# Patient Record
Sex: Female | Born: 1955 | Race: White | Hispanic: No | Smoking: Never smoker
Health system: Southern US, Academic
[De-identification: ages and names within clinical notes are randomized; demographics above are authoritative.]

## PROBLEM LIST (undated history)

## (undated) DIAGNOSIS — R002 Palpitations: Secondary | ICD-10-CM

## (undated) DIAGNOSIS — E079 Disorder of thyroid, unspecified: Secondary | ICD-10-CM

## (undated) DIAGNOSIS — M5126 Other intervertebral disc displacement, lumbar region: Secondary | ICD-10-CM

## (undated) DIAGNOSIS — M51369 Other intervertebral disc degeneration, lumbar region without mention of lumbar back pain or lower extremity pain: Secondary | ICD-10-CM

## (undated) DIAGNOSIS — I499 Cardiac arrhythmia, unspecified: Secondary | ICD-10-CM

## (undated) DIAGNOSIS — J309 Allergic rhinitis, unspecified: Secondary | ICD-10-CM

## (undated) DIAGNOSIS — K219 Gastro-esophageal reflux disease without esophagitis: Secondary | ICD-10-CM

## (undated) DIAGNOSIS — Z87898 Personal history of other specified conditions: Secondary | ICD-10-CM

## (undated) DIAGNOSIS — I10 Essential (primary) hypertension: Secondary | ICD-10-CM

## (undated) DIAGNOSIS — H269 Unspecified cataract: Secondary | ICD-10-CM

## (undated) DIAGNOSIS — Z87442 Personal history of urinary calculi: Secondary | ICD-10-CM

## (undated) DIAGNOSIS — E785 Hyperlipidemia, unspecified: Secondary | ICD-10-CM

## (undated) DIAGNOSIS — L719 Rosacea, unspecified: Secondary | ICD-10-CM

## (undated) DIAGNOSIS — M1990 Unspecified osteoarthritis, unspecified site: Secondary | ICD-10-CM

## (undated) DIAGNOSIS — H9190 Unspecified hearing loss, unspecified ear: Secondary | ICD-10-CM

## (undated) DIAGNOSIS — Z872 Personal history of diseases of the skin and subcutaneous tissue: Secondary | ICD-10-CM

## (undated) DIAGNOSIS — Z860100 Personal history of colon polyps, unspecified: Secondary | ICD-10-CM

## (undated) DIAGNOSIS — R7989 Other specified abnormal findings of blood chemistry: Secondary | ICD-10-CM

## (undated) DIAGNOSIS — R911 Solitary pulmonary nodule: Secondary | ICD-10-CM

## (undated) DIAGNOSIS — K449 Diaphragmatic hernia without obstruction or gangrene: Secondary | ICD-10-CM

## (undated) DIAGNOSIS — E559 Vitamin D deficiency, unspecified: Secondary | ICD-10-CM

## (undated) DIAGNOSIS — R251 Tremor, unspecified: Secondary | ICD-10-CM

## (undated) DIAGNOSIS — R7303 Prediabetes: Secondary | ICD-10-CM

## (undated) DIAGNOSIS — F32A Depression, unspecified: Secondary | ICD-10-CM

## (undated) DIAGNOSIS — E538 Deficiency of other specified B group vitamins: Secondary | ICD-10-CM

## (undated) DIAGNOSIS — C801 Malignant (primary) neoplasm, unspecified: Secondary | ICD-10-CM

## (undated) HISTORY — PX: HX CYSTOSCOPY: 2100001404

## (undated) HISTORY — DX: Depression, unspecified: F32.A

## (undated) HISTORY — PX: HX TONSILLECTOMY: SHX27

## (undated) HISTORY — DX: Tremor, unspecified: R25.1

## (undated) HISTORY — DX: Unspecified osteoarthritis, unspecified site: M19.90

## (undated) HISTORY — DX: Disorder of thyroid, unspecified: E07.9

## (undated) HISTORY — DX: Vitamin D deficiency, unspecified: E55.9

## (undated) HISTORY — DX: Deficiency of other specified B group vitamins: E53.8

## (undated) HISTORY — DX: Personal history of other specified conditions: Z87.898

## (undated) HISTORY — PX: HX TUBAL LIGATION: SHX77

## (undated) HISTORY — DX: Other intervertebral disc displacement, lumbar region: M51.26

## (undated) HISTORY — DX: Personal history of colon polyps, unspecified: Z86.0100

## (undated) HISTORY — DX: Personal history of diseases of the skin and subcutaneous tissue: Z87.2

## (undated) HISTORY — DX: Palpitations: R00.2

## (undated) HISTORY — PX: HEMORROIDECTOMY: SUR656

## (undated) HISTORY — DX: Personal history of urinary calculi: Z87.442

## (undated) HISTORY — DX: Unspecified hearing loss, unspecified ear: H91.90

## (undated) HISTORY — DX: Other specified abnormal findings of blood chemistry: R79.89

## (undated) HISTORY — PX: ABDOMINAL SURGERY: SHX537

## (undated) HISTORY — PX: KNEE SURGERY: SHX244

## (undated) HISTORY — DX: Solitary pulmonary nodule: R91.1

## (undated) HISTORY — PX: REPLACEMENT TOTAL KNEE: SUR1224

## (undated) HISTORY — DX: Other intervertebral disc degeneration, lumbar region without mention of lumbar back pain or lower extremity pain: M51.369

## (undated) HISTORY — DX: Cardiac arrhythmia, unspecified: I49.9

## (undated) HISTORY — DX: Rosacea, unspecified: L71.9

## (undated) HISTORY — PX: HX GALL BLADDER SURGERY/CHOLE: SHX55

## (undated) HISTORY — PX: DENTAL SURGERY: SHX609

## (undated) HISTORY — DX: Allergic rhinitis, unspecified: J30.9

## (undated) HISTORY — PX: COLONOSCOPY: SHX174

## (undated) HISTORY — PX: ORTHOPEDIC SURGERY: SHX850

## (undated) HISTORY — PX: HX KNEE REPLACMENT: SHX125

## (undated) HISTORY — PX: KNEE ARTHROSCOPY: SUR90

## (undated) HISTORY — PX: HX THYROID BIOPSY: 2101000001

## (undated) HISTORY — DX: Prediabetes: R73.03

## (undated) HISTORY — DX: Hyperlipidemia, unspecified: E78.5

## (undated) HISTORY — PX: HX CATARACT REMOVAL: SHX102

## (undated) HISTORY — PX: HX EYE SURGERY: 2100001143

## (undated) HISTORY — DX: Unspecified cataract: H26.9

---

## 1999-02-12 ENCOUNTER — Other Ambulatory Visit (HOSPITAL_COMMUNITY): Payer: Self-pay

## 2007-12-20 DIAGNOSIS — K5730 Diverticulosis of large intestine without perforation or abscess without bleeding: Secondary | ICD-10-CM | POA: Insufficient documentation

## 2007-12-20 DIAGNOSIS — K635 Polyp of colon: Secondary | ICD-10-CM | POA: Insufficient documentation

## 2017-08-30 IMAGING — MG 3D SCREENING MAMMO BIL W/CAD
5 series · 9 of 24 positions shown · non-contrast
Comparison: 05/10/2017 and 01/04/2016.

------------- REPORT [id number] -------------
[facility]
[address]
Daina Ms.TOWNER, EDILIA:
We wish to report the following on your recent mammography examination. We are sending a report to your referring physician or other health care provider. 
(       Normal/Negative:
No evidence of cancer.
This statement is mandated by the [place], Department of Health.
Your examination was performed by one of our technologists, who are registered radiological technologists and also specially certified in mammography:
___
Parlak, Edaly (M)
Dang, Mcalex (M)

Your mammogram was interpreted by our radiologist.
( 
Sofeine Made, M.D.
(Annual Breast Examination by a physician or other health care provider
(Annual Mammography Screening beginning at age 40
(Monthly Breast Self Examination
------------- REPORT [id number] -------------
NESIGILINK, AIVARUNCE
EXAM:  3D BILATERAL ANNUAL SCREENING DIGITAL MAMMOGRAM WITH TOMOSYNTHESIS AND CAD
INDICATION: Screening.

[Series 4891: R CC · right · 0.10mm/px · 2 of 3 slices shown]
[im 1/3]
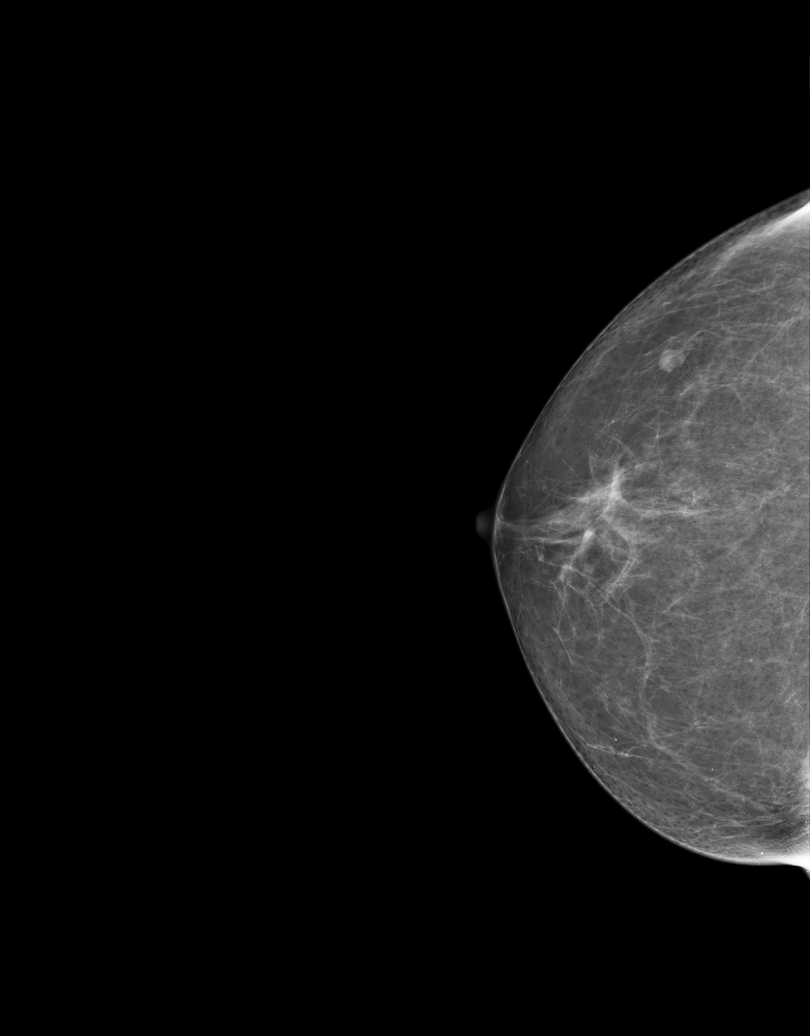
[im 3/3]
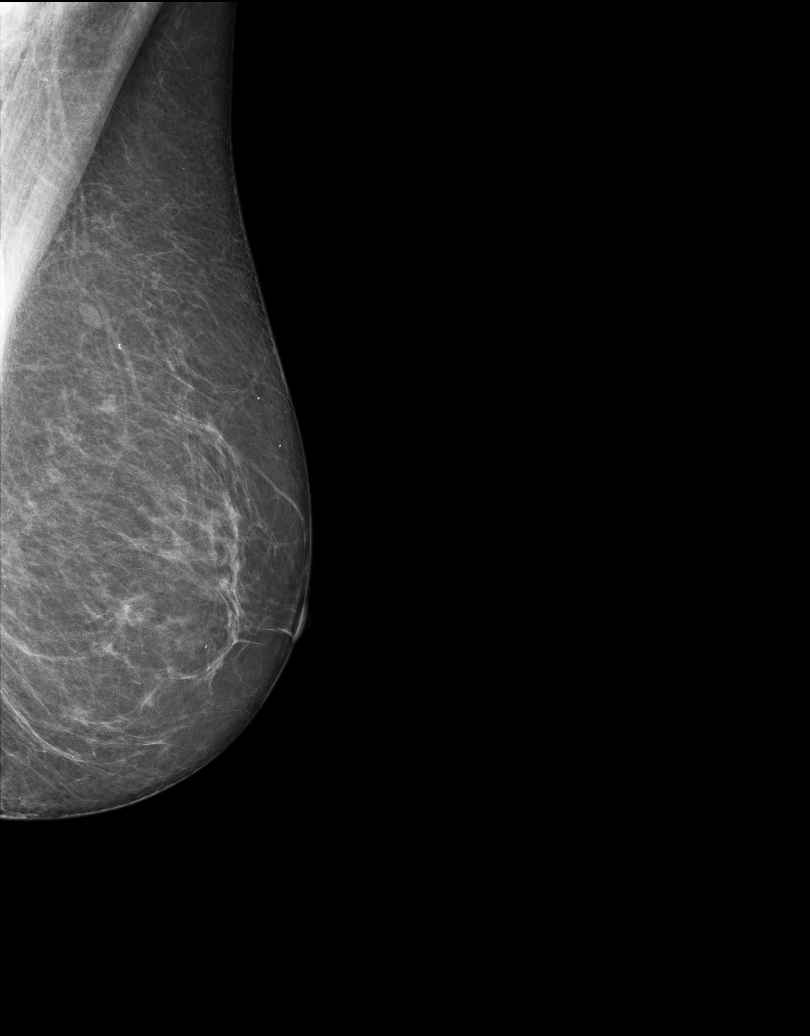

[R]
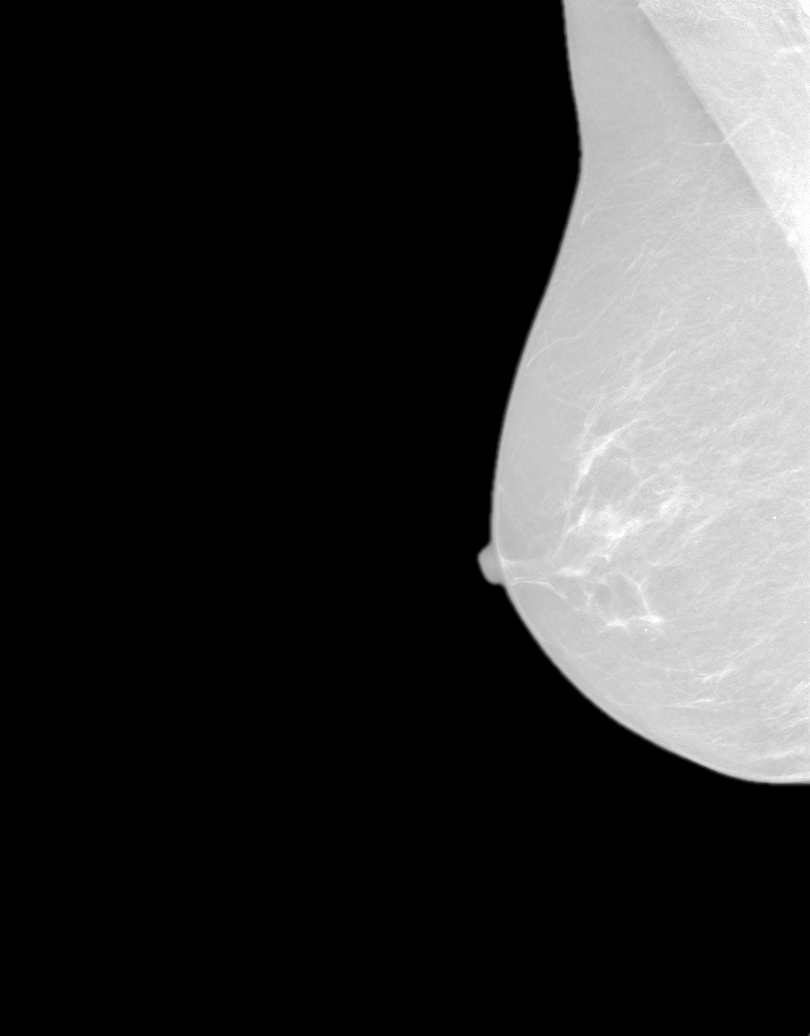

[Series 4896: 3D SCREENING MAMMO BIL W/CAD · 2 acquisitions, 4 frames shown (1 of 2)]
[im 1/2]
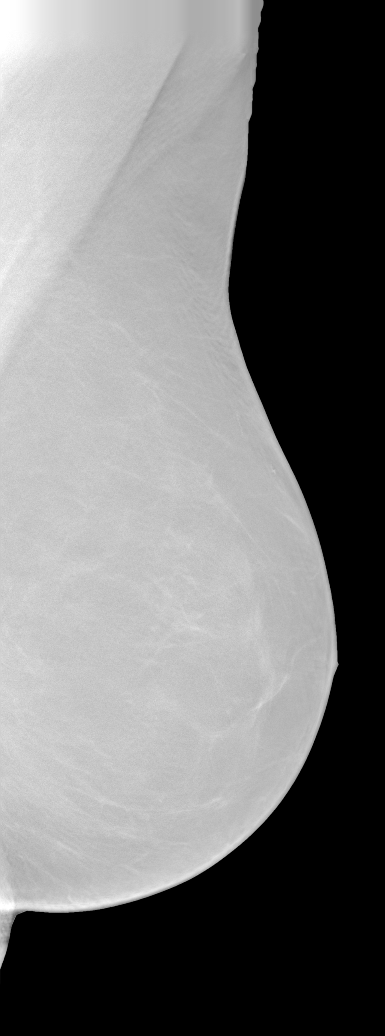
[im 1/2]
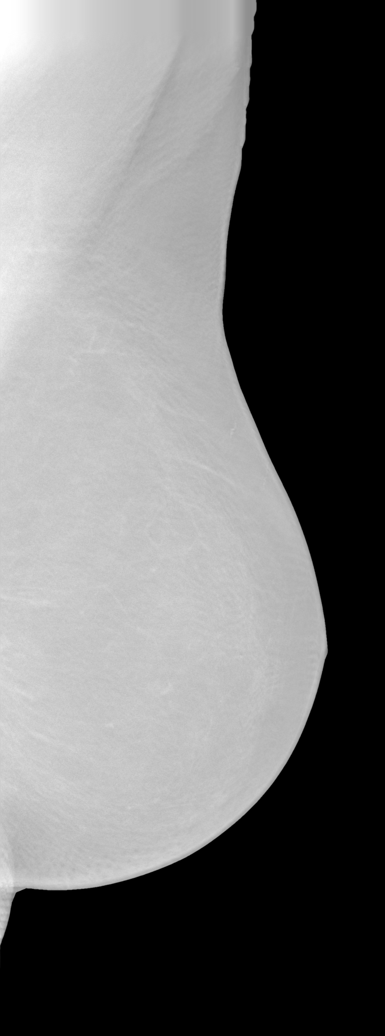
[im 2/2]
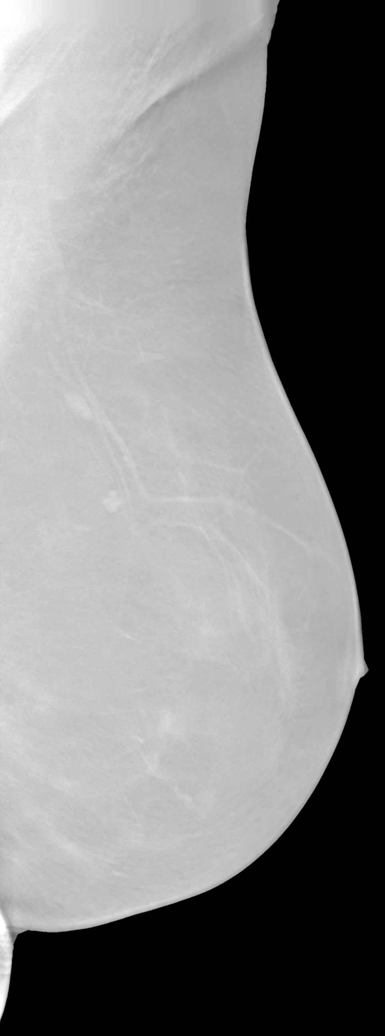
[im 2/2]
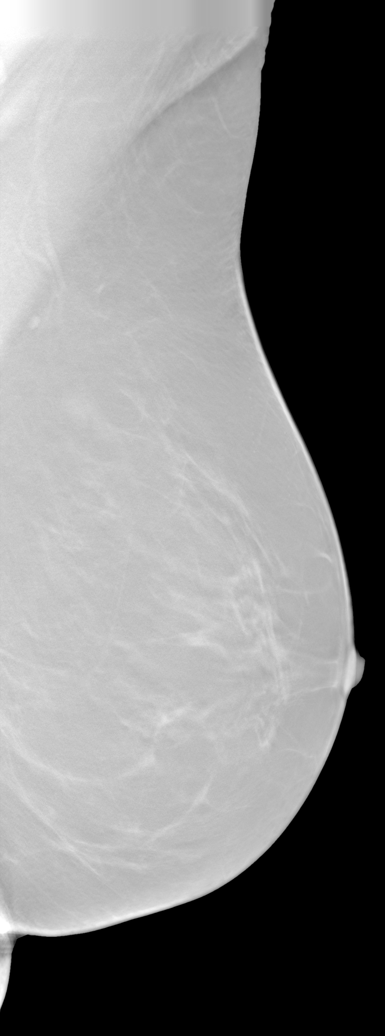

[3D SCREENING MAMMO BIL W/CAD (2 of 2) · tomo slice 13/83.0]
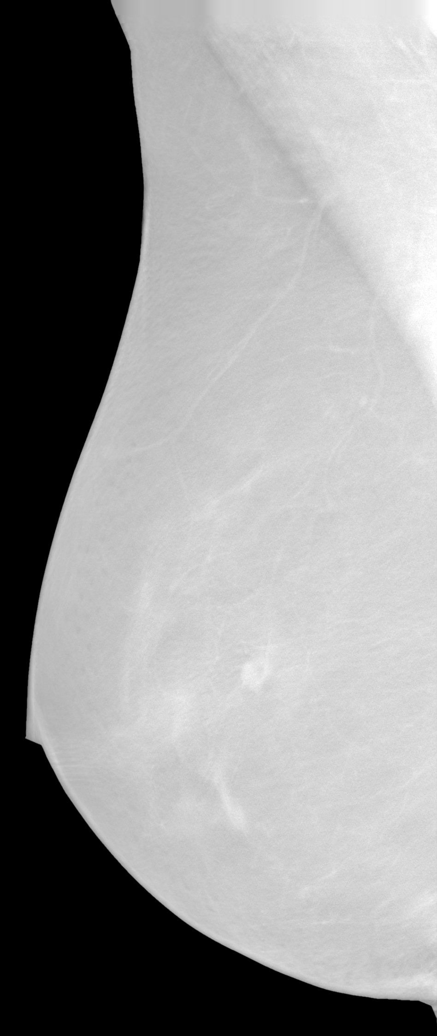

[L]
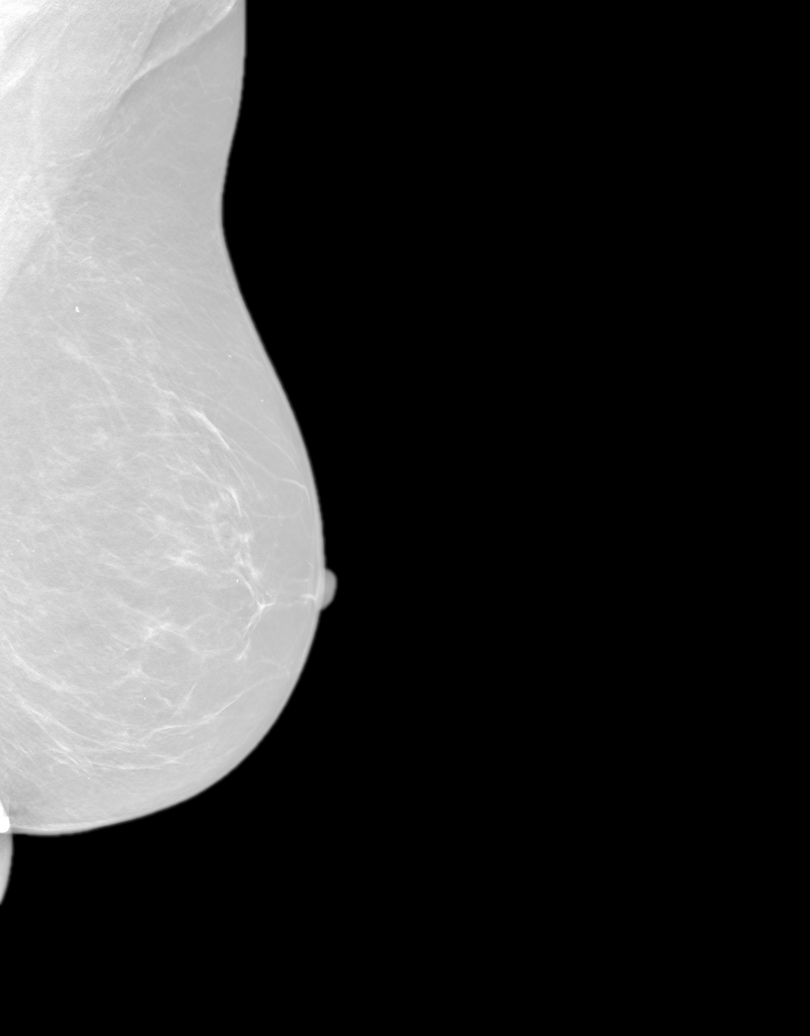

[9 of 24 positions shown; findings below may reference images not displayed]

FINDINGS: There are scattered fibroglandular elements.  There is no mass or suspicious cluster of microcalcifications.   There is no architectural distortion, skin thickening or nipple retraction.
IMPRESSION: 1.  BIRADS 2-Benign findings. Patient has been added in a reminder system with a target date for the next screening mammography.

2.  DENSITY CODE – B (Scattered areas of fibroglandular density). 

Final Assessment Code:

Bi-Rads 2 

BI-RADS 0
Need additional imaging evaluation

BI-RADS 1
Negative mammogram

BI-RADS 2
Benign finding

BI-RADS 3
Probably benign finding: short-interval follow-up suggested

BI-RADS 4
Suspicious abnormality:  biopsy should be considered

BI-RADS 5
Highly suggestive of malignancy; appropriate action should be taken

BI-RADS 6
Known Biopsy-proven Malignancy – Appropriate action should be taken

NOTE:
In compliance with Federal regulations, the results of this mammogram are being sent to the patient.

## 2018-04-05 DIAGNOSIS — F418 Other specified anxiety disorders: Secondary | ICD-10-CM | POA: Insufficient documentation

## 2018-04-05 DIAGNOSIS — E559 Vitamin D deficiency, unspecified: Secondary | ICD-10-CM | POA: Insufficient documentation

## 2018-04-05 DIAGNOSIS — T3695XA Adverse effect of unspecified systemic antibiotic, initial encounter: Secondary | ICD-10-CM | POA: Insufficient documentation

## 2018-04-05 DIAGNOSIS — I10 Essential (primary) hypertension: Secondary | ICD-10-CM | POA: Insufficient documentation

## 2018-04-05 DIAGNOSIS — Z1624 Resistance to multiple antibiotics: Secondary | ICD-10-CM | POA: Insufficient documentation

## 2018-06-28 DIAGNOSIS — M25562 Pain in left knee: Secondary | ICD-10-CM | POA: Insufficient documentation

## 2018-12-17 DIAGNOSIS — R9389 Abnormal findings on diagnostic imaging of other specified body structures: Secondary | ICD-10-CM | POA: Insufficient documentation

## 2021-02-03 DIAGNOSIS — K219 Gastro-esophageal reflux disease without esophagitis: Secondary | ICD-10-CM | POA: Insufficient documentation

## 2021-06-11 ENCOUNTER — Other Ambulatory Visit (INDEPENDENT_AMBULATORY_CARE_PROVIDER_SITE_OTHER): Payer: Self-pay | Admitting: OTOLARYNGOLOGY

## 2021-06-11 DIAGNOSIS — E041 Nontoxic single thyroid nodule: Secondary | ICD-10-CM

## 2021-06-22 IMAGING — CT CT PELVIS W/CONTRAST
2 of 4 series · 17 of 46 positions shown, 19 images · IV contrast (CONTRAST)
Comparison: None available.

﻿EXAM:  CT PELVIS W/CONTRAST
INDICATION: Right groin swelling.
TECHNIQUE: Axial CT imaging of the pelvis was performed following intravenous administration of 75 mL of Optiray 350.  Oral contrast was also administered. Images were reviewed in multiple windows and projections. Exam was performed using 1 or more of the following dose reduction techniques: Automated exposure control, adjustment of the mA and/or kV according to patient size, or the use of iterative reconstruction technique.

[Series 8020: post · axial · 0.97mm/px · z∈[-534,-285]mm · 14 of 97 slices shown, 16 images]
[im 7/97  soft-tissue]
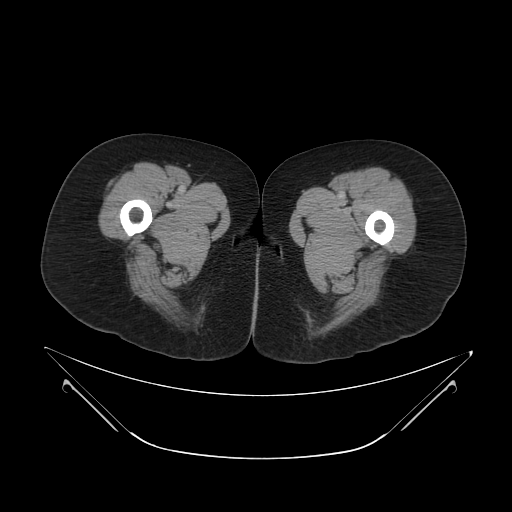
[im 7/97  bone]
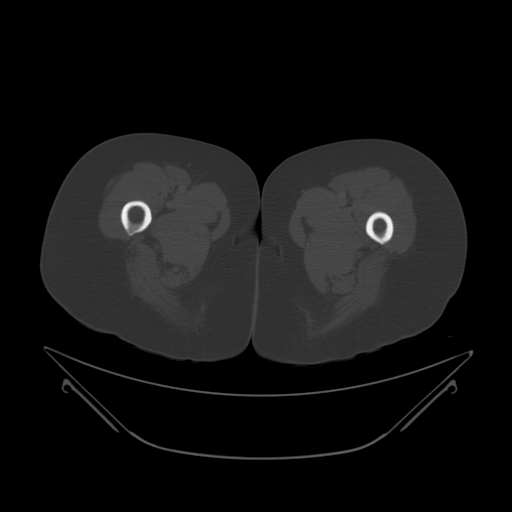
[im 13/97  soft-tissue]
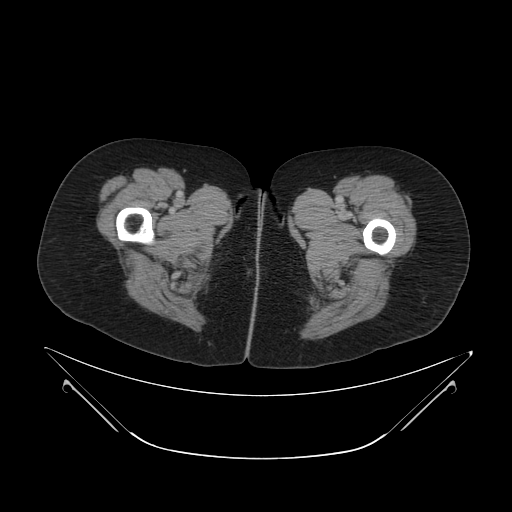
[im 20/97  soft-tissue]
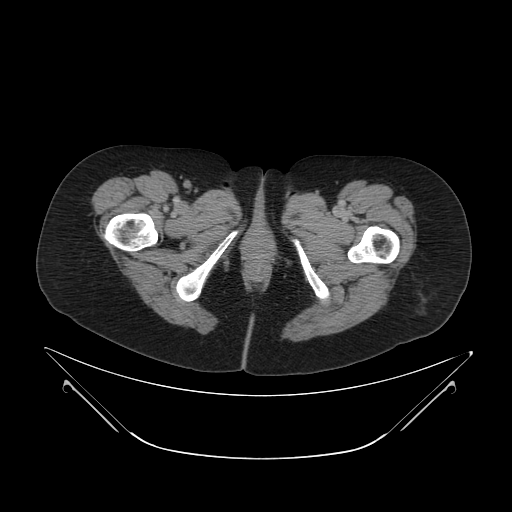
[im 26/97  soft-tissue]
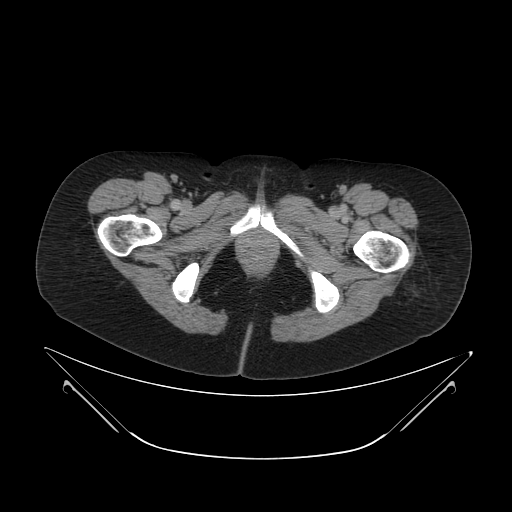
[im 33/97  soft-tissue]
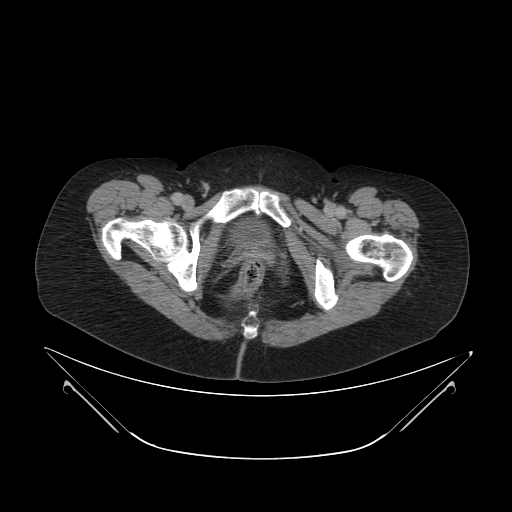
[im 39/97  soft-tissue]
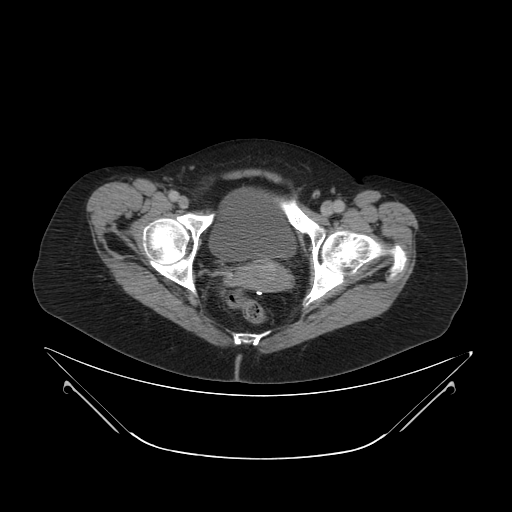
[im 45/97  soft-tissue]
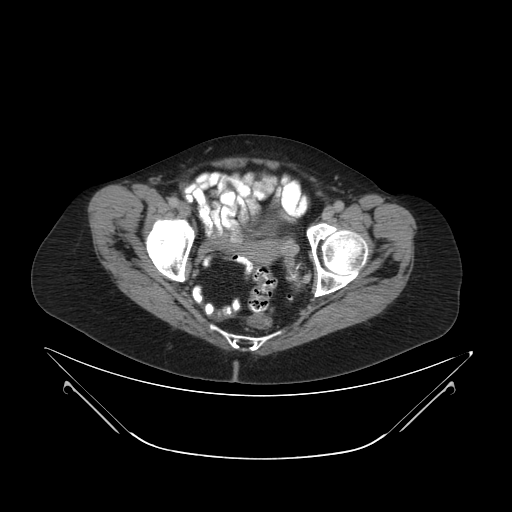
[im 52/97  soft-tissue]
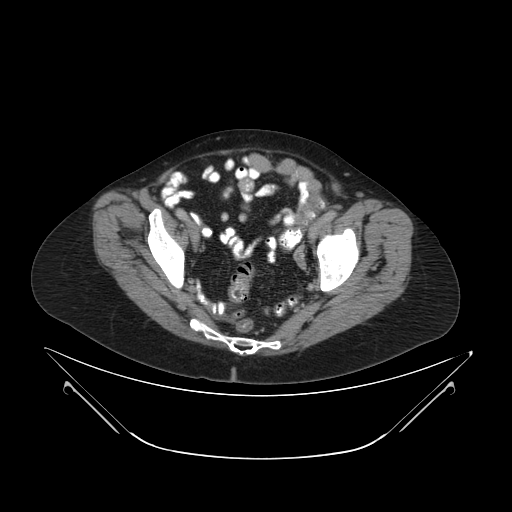
[im 58/97  soft-tissue]
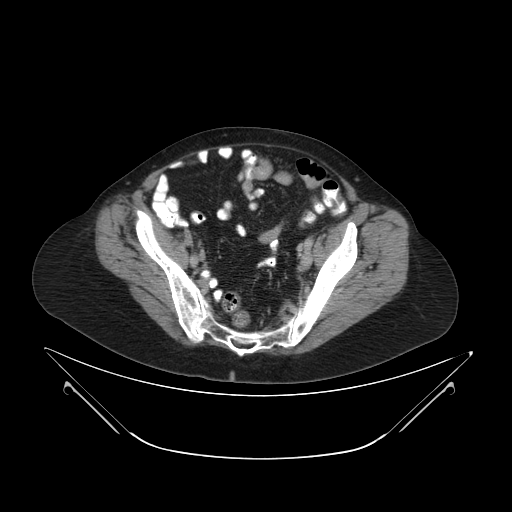
[im 58/97  bone]
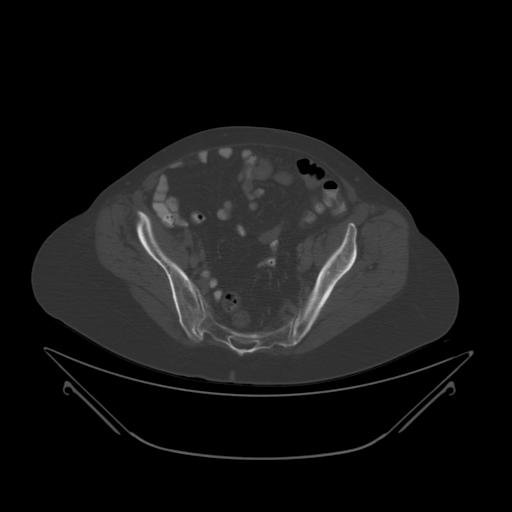
[im 65/97  soft-tissue]
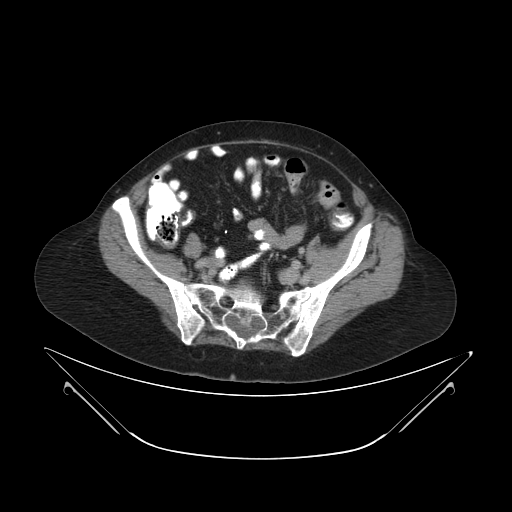
[im 71/97  soft-tissue]
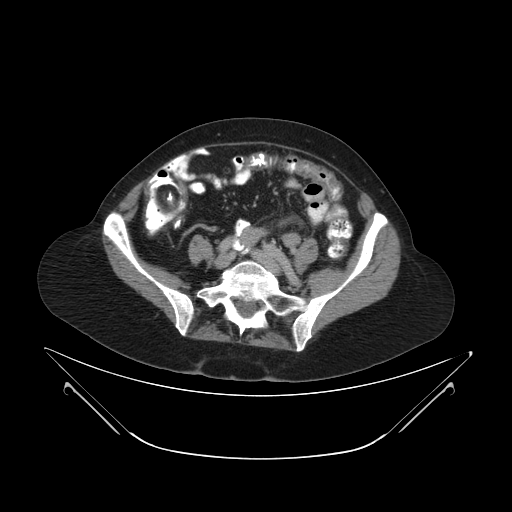
[im 77/97  soft-tissue]
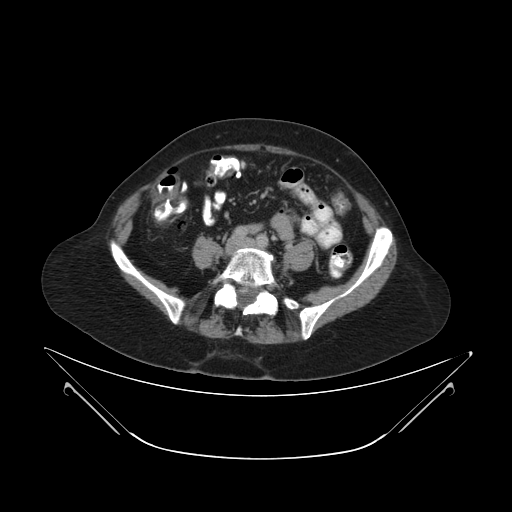
[im 84/97  soft-tissue]
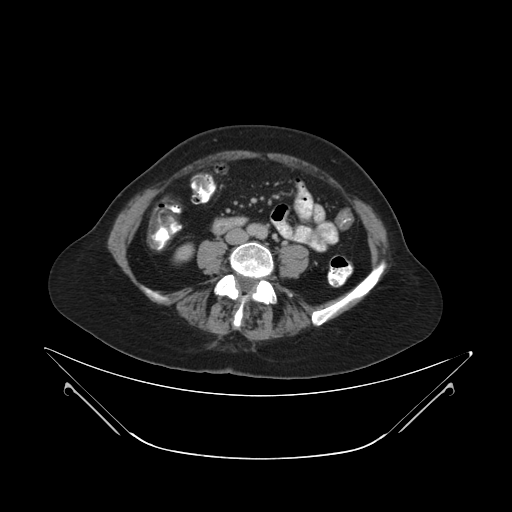
[im 90/97  soft-tissue]
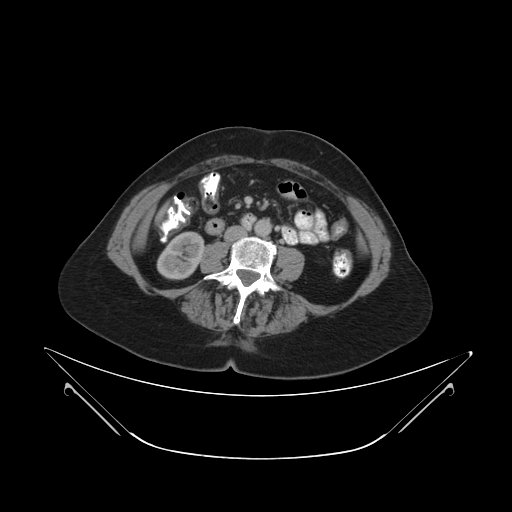

[Series 8021: cor · coronal · 0.97mm/px · 3 of 59 slices shown]
[im 20/59  soft-tissue]
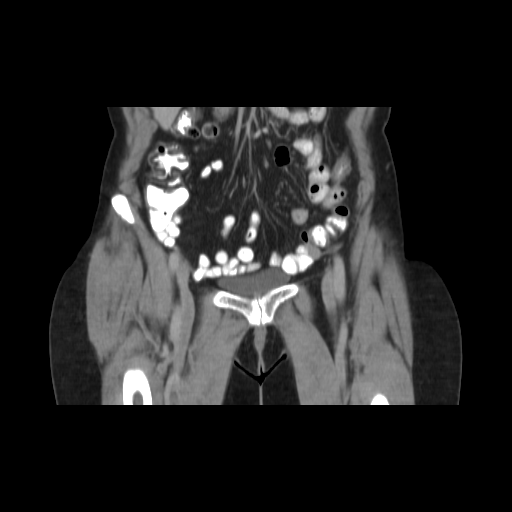
[im 26/59  soft-tissue]
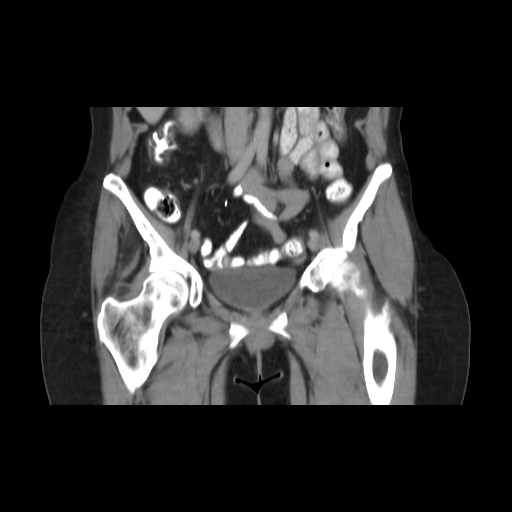
[im 33/59  soft-tissue]
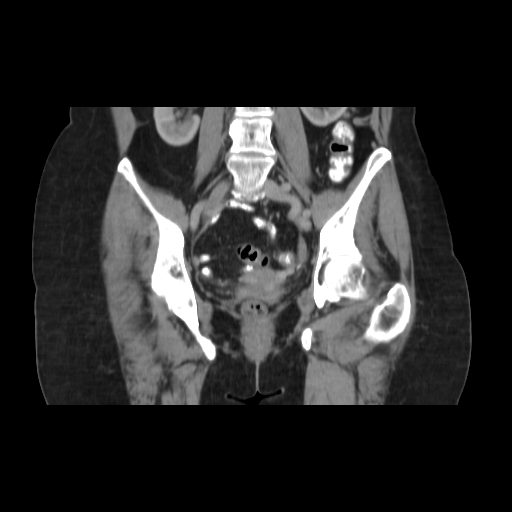

[17 of 46 positions shown; findings below may reference images not displayed]

FINDINGS: Visualized bowel loops are normal in course and caliber, there is no obstruction or free air.  No ascites or adenopathy seen. No inguinal hernia is identified.  Visualized osseous structures are also unremarkable.  There is no abnormal enhancement.
IMPRESSION: Unremarkable exam.

## 2021-07-26 ENCOUNTER — Other Ambulatory Visit: Payer: Medicare Other | Attending: Family | Admitting: Family

## 2021-07-26 ENCOUNTER — Encounter (INDEPENDENT_AMBULATORY_CARE_PROVIDER_SITE_OTHER): Payer: Self-pay | Admitting: Family

## 2021-07-26 ENCOUNTER — Ambulatory Visit (INDEPENDENT_AMBULATORY_CARE_PROVIDER_SITE_OTHER): Payer: Medicare Other | Admitting: Family

## 2021-07-26 ENCOUNTER — Other Ambulatory Visit: Payer: Self-pay

## 2021-07-26 VITALS — BP 141/90 | HR 65 | Resp 17 | Ht 68.11 in | Wt 165.6 lb

## 2021-07-26 DIAGNOSIS — N390 Urinary tract infection, site not specified: Secondary | ICD-10-CM

## 2021-07-26 DIAGNOSIS — E039 Hypothyroidism, unspecified: Secondary | ICD-10-CM | POA: Insufficient documentation

## 2021-07-26 DIAGNOSIS — F339 Major depressive disorder, recurrent, unspecified: Secondary | ICD-10-CM | POA: Insufficient documentation

## 2021-07-26 DIAGNOSIS — I519 Heart disease, unspecified: Secondary | ICD-10-CM

## 2021-07-26 DIAGNOSIS — Z87442 Personal history of urinary calculi: Secondary | ICD-10-CM | POA: Insufficient documentation

## 2021-07-26 DIAGNOSIS — R519 Headache, unspecified: Secondary | ICD-10-CM | POA: Insufficient documentation

## 2021-07-26 DIAGNOSIS — R251 Tremor, unspecified: Secondary | ICD-10-CM | POA: Insufficient documentation

## 2021-07-26 DIAGNOSIS — R3129 Other microscopic hematuria: Secondary | ICD-10-CM | POA: Insufficient documentation

## 2021-07-26 DIAGNOSIS — J45909 Unspecified asthma, uncomplicated: Secondary | ICD-10-CM | POA: Insufficient documentation

## 2021-07-26 HISTORY — DX: Heart disease, unspecified: I51.9

## 2021-07-26 HISTORY — DX: Urinary tract infection, site not specified: N39.0

## 2021-07-26 LAB — POCT URINE DIPSTICK
BILIRUBIN: NEGATIVE
GLUCOSE: NEGATIVE
KETONE: NEGATIVE
LEUKOCYTES: NEGATIVE
NITRITE: NEGATIVE
PH: 5.5
PROTEIN: NEGATIVE
SPECIFIC GRAVITY: 1.03
UROBILINOGEN: 0.2

## 2021-07-26 NOTE — Progress Notes (Signed)
[facility]Y, [facility]  [address]  [place] [address]      Return Patient Note     Name: Tonya Francis MRN:  [id number]   Date: 07/26/2021 Age/DOB:66 y.o. [birth year]        Name: Tonya Francis                       Date of Birth: 1955/10/24   MRN:  [id number]                         Date of visit: 07/26/2021     PCP: Garnette Scheuermann, DO   Referring Provider: No referring provider defined for this encounter.     HPI:  Tonya Francis is a 66 y.o. female who presents with Urinary Tract Infection (6 month follow up)   to clinic.      She has history of microscopic hematuria.  She has had gross hematuria on one occasion which was associated with a UTI.  She also has a history of stones.    She has passed stones spontaneously and has not required surgical intervention in the past.    She had cystoscopy that was unremarkable by Dr. Lorenza Chick at [facility] in 2011.  She had hematuria at the time.    She does not smoke.  There is no family history of bladder cancer.  She denies environmental exposures.    Urine culture 05/02/15: 50,000-100,000 CFU, E. coli  Urine culture 06/06/15: > 75,000-100,000 CFU, E. coli  Urine culture 06/30/15: negative    UA 12/2014: < 1 RBC/hpf  UA 03/2015: 3 RBCs/hpf  UA 06/04/15: 4 RBCs/hpf  UA 06/30/15: 1 RBC/hpf    06/30/15 cytology: mild inflammation and scattered RBCs    07/13/15 CT urogram: 4 mm left renal stone, bilateral renal cysts including peripelvic on the left; small hiatal hernia, heterogeneous uterus with a possible prominent endometrial stripe; serpiginous left periuterine structure; L5-S1 disc space narrowing    She underwent cystoscopy 08/18/15. There were no stones, tumors, or areas of inflammation within the lower urinary tract.    01/2017 KUB showed a small left renal stone (unchanged).    11/12/2018 UA showed trace blood. Urine culture showed contamination and cytology was negative for malignancy.    She has occasional vaginal discharge but denies dysuria and  gross hematuria.  She has yearly female exams due to family history of female cancer.    She has chronic back pain.    CT abd and pelvis with contrast 05/21/19: bilateral peripelvic cysts (L > R), no obstruction.    01/27/20 UA showed blood. Urine cytology 01/27/20 was negative.    UA 07/26/21 showed blood.    Past Medical History  Current Outpatient Medications   Medication Sig   . atenoloL (TENORMIN) 25 mg Oral Tablet Take 1 Tablet (25 mg total) by mouth Once a day   . calcium carbonate (TUMS ORAL) Take 1 Tablet by mouth Once a day   . cyanocobalamin (VITAMIN B12) 1,000 mcg/mL Injection Solution 1 mL (1,000 mcg total) Every 30 days   . ergocalciferol, vitamin D2, (DRISDOL) 1,250 mcg (50,000 unit) Oral Capsule Take 1 Capsule (50,000 Units total) by mouth Every 7 days   . fluticasone propionate (FLONASE) 50 mcg/actuation Nasal Spray, Suspension Administer 1 Spray into each nostril Twice daily   . Ibuprofen (MOTRIN) 800 mg Oral Tablet Take 1 Tablet (800 mg total) by mouth  Three times a day   . lactulose (ENULOSE) 10 gram/15 mL Oral Solution Take 15 mL by mouth Once a day   . levothyroxine (SYNTHROID) 88 mcg Oral Tablet Take 1 Tablet (88 mcg total) by mouth Once a day   . multivitamin Oral Tablet Take 1 Tablet by mouth Once a day   . prenatal vitamin-iron-folate Tablet Take 1 Tablet by mouth Once a day     Allergies   Allergen Reactions   . Latex Itching   . Sulfa (Sulfonamides) Swelling and Angioedema     Past Medical History:   Diagnosis Date   . Cardiac disease 07/26/2021   . UTI (urinary tract infection) 07/26/2021         Past Surgical History:   Procedure Laterality Date   . ABDOMINAL SURGERY      BOWEL BLOCKAGE   . DENTAL SURGERY     . HEMORROIDECTOMY     . HX CHOLECYSTECTOMY     . HX CYSTOSCOPY      BIOPSY OF UTERUS   . HX EYE SURGERY     . HX THYROID BIOPSY     . HX TONSILLECTOMY     . HX TUBAL LIGATION     . KNEE ARTHROSCOPY Right    . ORTHOPEDIC SURGERY Right     BROKEN RIGHT ARM   . REPLACEMENT TOTAL KNEE            Family Medical History:     Problem Relation (Age of Onset)    Breast Cancer Sister    COPD Other    Congestive Heart Failure Other    Coronary Artery Disease Father    Elevated Lipids Other    Heart Attack Other    Heart Disease Brother    Hypertension (High Blood Pressure) Father    Kidney Stones Brother    Primary Brain tumor Sister    Stroke Mother          Social History     Socioeconomic History   . Marital status: Widowed   Tobacco Use   . Smoking status: Never   . Smokeless tobacco: Never   Substance and Sexual Activity   . Alcohol use: Never   . Drug use: Never     Social Determinants of Health     Financial Resource Strain: Low Risk    . SDOH Financial: No   Transportation Needs: Low Risk    . SDOH Transportation: No   Social Connections: Low Risk    . SDOH Social Isolation: 5 or more times a week   Intimate Partner Violence: Low Risk    . SDOH Domestic Violence: No   Housing Stability: Low Risk    . SDOH Housing Situation: I have housing.   Marland Kitchen SDOH Housing Worry: No        Patient Active Problem List    Diagnosis Date Noted   . Asthma without status asthmaticus 07/26/2021     Formatting of this note might be different from the original.  as a child     . Headache 07/26/2021   . History of kidney stones 07/26/2021     Formatting of this note might be different from the original.  no surgical intervention at this time done for this, able to pass     . Microscopic hematuria 07/26/2021     Apr 09, 2015 Entered By: Abelino Derrick Comment: cysto 2013 wnlJul 12, 2017 Entered By: Abelino Derrick Comment: cysto wnl 08/2015     . Recurrent  major depressive disorder (CMS HCC) 07/26/2021   . 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 Tremor 07/26/2021   . UTI (urinary tract infection) 07/26/2021   . Cardiac disease 07/26/2021   . GERD (gastroesophageal reflux disease) 02/03/2021   . Thickened endometrium 12/17/2018   . Hypertension 04/05/2018   . Situational anxiety 04/05/2018     Mar 08, 2004 Entered By: Frutoso Chase  Comment: followed in mental health,    Last Assessment & Plan:   Formatting of this note might be different from the original.  PRN diazepam use since 08-04-2009 following death of husband. Anxiety is situational, related to hospitals and nursing homes.   Continue 5 mg PRN.     Marland Kitchen Vitamin D deficiency 04/05/2018     Formatting of this note might be different from the original.  Last Assessment & Plan:   Formatting of this note might be different from the original.  Continues vitamin D 2000 mg daily.  Last Assessment & Plan:   Formatting of this note might be different from the original.  Continues vitamin D 2000 mg daily.          REVIEW OF SYSTEMS:   As per HPI.      Physical Exam  Constitutional:       Appearance: Normal appearance.   Pulmonary:      Effort: Pulmonary effort is normal. No respiratory distress.   Neurological:      Mental Status: She is alert.   Psychiatric:         Mood and Affect: Mood normal.       BP (!) 141/90   Pulse 65   Resp 17   Ht 1.73 m (5' 8.11")   Wt 75.1 kg (165 lb 9.6 oz)   SpO2 98%   BMI 25.10 kg/m         Assessment/Plan  Assessment/Plan   1. UTI (urinary tract infection)    2. Microscopic hematuria        Follow-up in 6 months with UA.    Kataleah Bejar, FNP-C

## 2021-07-26 NOTE — Progress Notes (Signed)
66 yo female patient with hematuria and history of stones.    PCP: Dr. Rubin Payor    She has history of microscopic hematuria.  She has had gross hematuria on one occasion which was associated with a UTI.  She also has a history of stones.    She has passed stones spontaneously and has not required surgical intervention in the past.    She had cystoscopy that was unremarkable by Dr. Lorenza Chick at [facility] in 2011.  She had hematuria at the time.    She does not smoke.  There is no family history of bladder cancer.  She denies environmental exposures.    Urine culture 05/02/15: 50,000-100,000 CFU, E. coli  Urine culture 06/06/15: > 75,000-100,000 CFU, E. coli  Urine culture 06/30/15: negative    UA 12/2014: < 1 RBC/hpf  UA 03/2015: 3 RBCs/hpf  UA 06/04/15: 4 RBCs/hpf  UA 06/30/15: 1 RBC/hpf    06/30/15 cytology: mild inflammation and scattered RBCs    07/13/15 CT urogram: 4 mm left renal stone, bilateral renal cysts including peripelvic on the left; small hiatal hernia, heterogeneous uterus with a possible prominent endometrial stripe; serpiginous left periuterine structure; L5-S1 disc space narrowing    She underwent cystoscopy 08/18/15. There were no stones, tumors, or areas of inflammation within the lower urinary tract.    01/2017 KUB showed a small left renal stone (unchanged).    11/12/2018 UA showed trace blood. Urine culture showed contamination and cytology was negative for malignancy.    She has occasional vaginal discharge but denies dysuria and gross hematuria.  She has yearly female exams due to family history of female cancer.    She has chronic back pain.    CT abd and pelvis with contrast 05/21/19: bilateral peripelvic cysts (L > R), no obstruction.    01/27/20 UA showed blood. Urine cytology 01/27/20 was negative.    UA 07/27/20 showed blood and leuks. She has low back pain (different from her typical back pain).

## 2021-07-27 DIAGNOSIS — R3129 Other microscopic hematuria: Secondary | ICD-10-CM

## 2021-07-27 LAB — CYTOPATHOLOGY, URINARY

## 2021-08-11 ENCOUNTER — Encounter (HOSPITAL_COMMUNITY): Payer: Medicare Other | Admitting: Surgery

## 2021-08-11 ENCOUNTER — Encounter (HOSPITAL_COMMUNITY): Payer: Self-pay | Admitting: Surgery

## 2021-08-11 ENCOUNTER — Encounter (HOSPITAL_COMMUNITY): Payer: Medicare Other | Admitting: Certified Registered"

## 2021-08-11 ENCOUNTER — Inpatient Hospital Stay
Admission: RE | Admit: 2021-08-11 | Discharge: 2021-08-11 | Disposition: A | Discharge status: Home / Self Care | Payer: Medicare Other | Source: Ambulatory Visit | Attending: Surgery | Admitting: Surgery

## 2021-08-11 ENCOUNTER — Encounter (HOSPITAL_COMMUNITY)
Admission: RE | Disposition: A | Discharge status: Home / Self Care | Payer: Self-pay | Source: Ambulatory Visit | Attending: Surgery

## 2021-08-11 ENCOUNTER — Other Ambulatory Visit: Payer: Self-pay

## 2021-08-11 DIAGNOSIS — Q438 Other specified congenital malformations of intestine: Secondary | ICD-10-CM | POA: Insufficient documentation

## 2021-08-11 DIAGNOSIS — Z1211 Encounter for screening for malignant neoplasm of colon: Secondary | ICD-10-CM | POA: Insufficient documentation

## 2021-08-11 DIAGNOSIS — K641 Second degree hemorrhoids: Secondary | ICD-10-CM | POA: Insufficient documentation

## 2021-08-11 DIAGNOSIS — K5730 Diverticulosis of large intestine without perforation or abscess without bleeding: Secondary | ICD-10-CM | POA: Insufficient documentation

## 2021-08-11 HISTORY — DX: Essential (primary) hypertension: I10

## 2021-08-11 HISTORY — DX: Gastro-esophageal reflux disease without esophagitis: K21.9

## 2021-08-11 HISTORY — DX: Diaphragmatic hernia without obstruction or gangrene: K44.9

## 2021-08-11 HISTORY — DX: Disorder of thyroid, unspecified: E07.9

## 2021-08-11 SURGERY — COLONOSCOPY
Anesthesia: General | Wound class: Clean Contaminated Wounds-The respiratory, GI, Genital, or urinary

## 2021-08-11 MED ORDER — PROPOFOL 10 MG/ML INTRAVENOUS EMULSION
Freq: Once | INTRAVENOUS | Status: DC | PRN
Start: 2021-08-11 — End: 2021-08-11
  Administered 2021-08-11: 25 mL via INTRAVENOUS

## 2021-08-11 MED ORDER — DEXTROSE 5 % AND LACTATED RINGERS INTRAVENOUS SOLUTION
INTRAVENOUS | Status: DC
Start: 2021-08-11 — End: 2021-08-11

## 2021-08-11 NOTE — Discharge Instructions (Addendum)
SURGICAL DISCHARGE INSTRUCTIONS     Dr. Renette Butters, Gene B, MD  performed your COLONOSCOPY today at [facility]:  Monday through Friday from 8 a.m. - 4 p.m.: [phone]    For T&D: [phone]  Between 4 p.m. - 8 a.m., weekends and holidays:  Call ER [phone]    PLEASE SEE WRITTEN HANDOUTS AS DISCUSSED BY YOUR NURSE:  Destani Wamser    SIGNS AND SYMPTOMS OF A WOUND / INCISION INFECTION   Be sure to watch for the following:  Increase in redness or red streaks near or around the wound or incision.  Increase in pain that is intense or severe and cannot be relieved by the pain medication that your doctor has given you.  Increase in swelling that cannot be relieved by elevation of a body part, or by applying ice, if permitted.  Increase in drainage, or if yellow / green in color and smells bad. This could be on a dressing or a cast.  Increase in fever for longer than 24 hours, or an increase that is higher than 101 degrees Fahrenheit (normal body temperature is 98 degrees Fahrenheit). The incision may feel warm to the touch.    **CALL YOUR DOCTOR IF ONE OR MORE OF THESE SIGNS / SYMPTOMS SHOULD OCCUR.    ANESTHESIA INFORMATION   ANESTHESIA -- ADULT PATIENTS:  You have received intravenous sedation / general anesthesia, and you may feel drowsy and light-headed for several hours. You may even experience some forgetfulness of the procedure. DO NOT DRIVE A MOTOR VEHICLE or perform any activity requiring complete alertness or coordination until you feel fully awake in about 24-48 hours. Do not drink alcoholic beverages for at least 24 hours. Do not stay alone, you must have a responsible adult available to be with you. You may also experience a dry mouth or nausea for 24 hours. This is a normal side effect and will disappear as the effects of the medication wear off.    REMEMBER   If you experience any difficulty breathing, chest pain, bleeding that you feel is excessive,  persistent nausea or vomiting or for any other concerns:  Call your physician Dr.  Renette Butters, Bruna Potter, MD   at [phone] . You may also ask to have the general doctor on call paged. They are available to you 24 hours a day.      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 FOLLOW UP AS DISCUSSED.    FOLLOW-UP APPOINTMENTS   Please call your surgeon's office at the number listed to schedule a date / time of return for follow-up.     Dr Laqueta Due  [phone]

## 2021-08-11 NOTE — Anesthesia Preprocedure Evaluation (Signed)
ANESTHESIA PRE-OP EVALUATION  Planned Procedure: COLONOSCOPY  Review of Systems                   Pulmonary   asthma,   Cardiovascular    Hypertension ,No peripheral edema,  Exercise Tolerance: > or = 4 METS        GI/Hepatic/Renal    hiatal hernia and GERD        Endo/Other    hypothyroidism,      Neuro/Psych/MS    headaches, anxiety     Cancer                      Physical Assessment      Airway       Mallampati: III                  Dental       Dentition intact             Pulmonary    Breath sounds clear to auscultation  (-) no rhonchi, no decreased breath sounds, no wheezes, no rales and no stridor     Cardiovascular    Rhythm: regular  Rate: Normal  (-) no friction rub, carotid bruit is not present, no peripheral edema and no murmur     Other findings            Plan  ASA 3     Planned anesthesia type: general     total intravenous anesthesia                        Anesthetic plan and risks discussed with patient  Signed consent obtained            Patient's NPO status is appropriate for Anesthesia.

## 2021-08-11 NOTE — OR Surgeon (Signed)
[facility]      Patient Name: Tonya Francis, Tonya Francis  Hospital Number: [id number]  Date of Service: 08/11/2021   Date of Birth: Nov 02, 1955      Pre-Operative Diagnosis: HIGH RISK SCREENING     Post-Operative Diagnosis: mild sigmoid diverticulosis  redundant colon  scattered diverticulosis  Grade II hemorrhoids    Procedure(s)/Description:  COLONOSCOPY: 54098 (CPT)     Attending Surgeon: Willow Ora, MD     Anesthesia:  CRNA: Carmina Miller, CRNA    Anesthesia Type: .General       The patient indicates that they have read and understood the preoperative colonoscopy consent form. The benefits, risks and alternatives to the procedure were discussed. I specifically discussed the risk of bleeding and/or perforation requiring operation.The patient indicates they have no further question and wish to proceed. Informed consent was obtained from the patient and/or medical power of attorney.    The patient was brought into the procedure room and placed on the table in the left lateral decubitus position. After IV sedation was given, full finger digital rectal examination was performed with a circumferential sweep of the distal rectal mucosa. Subsequently, the flexible colonoscope was inserted into the rectum and passed without any difficulty. The colonoscope was then advanced up into the sigmoid colon, descending colon, transverse colon, right colon and cecum without any difficulty. Gross examination of each section of the colon was performed. Cecal intubation was achieved and the appendiceal orifice and ileocecal valve were identified. The operative findings of diverticulosis were noted as described above. The colonoscope was withdrawn carefully examining the mucosa as the scope was being extracted with particular attention paid to the proximal sides of folds, flexures, bends and rectal valves. At approximately 10 cm. from the anal verge, the colonoscope was retroflexed to fully examine the distal  rectum. The colonoscope was removed and a repeat digital rectal examination was performed at the completion of the procedure. The patient tolerated the procedure well. No intraoperative complications were encountered.    EKG, pulse, pulse oximetry and blood pressure were monitored throughout the entire procedure.    There were no unplanned events.    The patient was instructed to contact me if they have any problems with their colon such as bleeding, pain or changes in bowel habits. They understood and agreed to do so.          Dao Mearns B Adhvik Canady, MD,MBA,FACS

## 2021-08-11 NOTE — H&P (Signed)
[facility]  General Surgery  History and Physical    Date of Service:  08/11/2021  Tonya Francis, Tonya Francis, 66 y.o. female  Date of Admission:  08/11/2021  Date of Birth:  Sep 10, 1955  PCP: Garnette Scheuermann, DO    Reason for admission:  Colonoscopy    HPI:  Tonya Francis is a 66 y.o. White female who is admitted for HIGH RISK SCREENING     Past Medical History:   Diagnosis Date   . Cardiac disease 07/26/2021   . UTI (urinary tract infection) 07/26/2021      Past Surgical History:   Procedure Laterality Date   . ABDOMINAL SURGERY      BOWEL BLOCKAGE   . DENTAL SURGERY     . HEMORROIDECTOMY     . HX CHOLECYSTECTOMY     . HX CYSTOSCOPY      BIOPSY OF UTERUS   . HX EYE SURGERY     . HX THYROID BIOPSY     . HX TONSILLECTOMY     . HX TUBAL LIGATION     . KNEE ARTHROSCOPY Right    . ORTHOPEDIC SURGERY Right     BROKEN RIGHT ARM   . REPLACEMENT TOTAL KNEE        Social History     Tobacco Use   . Smoking status: Never   . Smokeless tobacco: Never   Substance Use Topics   . Alcohol use: Never   . Drug use: Never       Family Medical History:     Problem Relation (Age of Onset)    Breast Cancer Sister    COPD Other    Congestive Heart Failure Other    Coronary Artery Disease Father    Elevated Lipids Other    Heart Attack Other    Heart Disease Brother    Hypertension (High Blood Pressure) Father    Kidney Stones Brother    Primary Brain tumor Sister    Stroke Mother         Medications Prior to Admission     Prescriptions    atenoloL (TENORMIN) 25 mg Oral Tablet    Take 1 Tablet (25 mg total) by mouth Once a day    calcium carbonate (TUMS ORAL)    Take 1 Tablet by mouth Once a day    cyanocobalamin (VITAMIN B12) 1,000 mcg/mL Injection Solution    1 mL (1,000 mcg total) Every 30 days    ergocalciferol, vitamin D2, (DRISDOL) 1,250 mcg (50,000 unit) Oral Capsule    Take 1 Capsule (50,000 Units total) by mouth Every 7 days    fluticasone propionate (FLONASE) 50 mcg/actuation Nasal Spray, Suspension    Administer 1  Spray into each nostril Twice daily    Ibuprofen (MOTRIN) 800 mg Oral Tablet    Take 1 Tablet (800 mg total) by mouth Three times a day    lactulose (ENULOSE) 10 gram/15 mL Oral Solution    Take 15 mL by mouth Once a day    levothyroxine (SYNTHROID) 88 mcg Oral Tablet    Take 1 Tablet (88 mcg total) by mouth Once a day    multivitamin Oral Tablet    Take 1 Tablet by mouth Once a day    prenatal vitamin-iron-folate Tablet    Take 1 Tablet by mouth Once a day         Allergies   Allergen Reactions   . Latex Itching   . Sulfa (Sulfonamides) Swelling and Angioedema  No data found.       General: appropriate for age. in no acute distress.    Vital signs are present above and have been reviewed by me     HEENT: Atraumatic, Normocephalic. PERRLA, EOMI. Nose clear. Throat clear.    Lungs: Nonlabored breathing with symmetric expansion.  Clear to auscultation bilaterally    Heart:Regular wth respect to rate and rythmn.    Abdomen:Soft. Nontender. Nondistended and benign    Extremities:  Grossly normal with good range of motion and no major deformities.    Neuro:  Grossly normal motor and sensory function. CN's II through XII intact.    Psychiatric: Alert and oriented to person, place, and time. affect appropriate    Laboratory Data:     No results found for any visits on 08/11/21 (from the past 24 hour(s)).    Imaging Studies:    No orders to display        Assessment/Plan:  HIGH RISK SCREENING    Colonoscopy scheduled for Wednesday August 11, 2021    This note was partially created using voice recognition software and is inherently subject to errors including those of syntax and "sound alike " substitutions which may escape proof reading. In such instances, original meaning may be extrapolated by contextual derivation.    Willow Ora, MD, MBA, FACS

## 2021-08-11 NOTE — Anesthesia Postprocedure Evaluation (Signed)
Anesthesia Post Op Evaluation    Patient: Tonya Francis  Procedure(s):  COLONOSCOPY    Last Vitals:Temperature: 36.4 C (97.5 F) (08/11/21 1011)  Heart Rate: 65 (08/11/21 1011)  BP (Non-Invasive): 119/69 (08/11/21 1011)  Respiratory Rate: 16 (08/11/21 1011)  SpO2: 100 % (08/11/21 1011)    No notable events documented.    Patient is sufficiently recovered from the effects of anesthesia to participate in the evaluation and has returned to their pre-procedure level.  Patient location during evaluation: PACU       Patient participation: complete - patient participated  Level of consciousness: awake and alert and responsive to verbal stimuli    Pain score: 0  Pain management: adequate  Airway patency: patent    Anesthetic complications: no  Cardiovascular status: acceptable  Respiratory status: acceptable  Hydration status: acceptable  Patient post-procedure temperature: Pt Normothermic   PONV Status: Absent

## 2021-09-14 ENCOUNTER — Inpatient Hospital Stay
Admission: RE | Admit: 2021-09-14 | Discharge: 2021-09-14 | Disposition: A | Discharge status: Home / Self Care | Payer: Medicare Other | Source: Ambulatory Visit | Attending: OTOLARYNGOLOGY | Admitting: OTOLARYNGOLOGY

## 2021-09-14 ENCOUNTER — Other Ambulatory Visit: Payer: Self-pay

## 2021-09-14 DIAGNOSIS — E041 Nontoxic single thyroid nodule: Secondary | ICD-10-CM | POA: Insufficient documentation

## 2021-09-21 ENCOUNTER — Encounter (INDEPENDENT_AMBULATORY_CARE_PROVIDER_SITE_OTHER): Payer: Self-pay | Admitting: OTOLARYNGOLOGY

## 2021-09-21 ENCOUNTER — Telehealth (INDEPENDENT_AMBULATORY_CARE_PROVIDER_SITE_OTHER): Payer: Self-pay | Admitting: OTOLARYNGOLOGY

## 2021-09-21 ENCOUNTER — Ambulatory Visit (INDEPENDENT_AMBULATORY_CARE_PROVIDER_SITE_OTHER): Payer: Medicare Other | Admitting: OTOLARYNGOLOGY

## 2021-09-21 ENCOUNTER — Other Ambulatory Visit: Payer: Self-pay

## 2021-09-21 VITALS — Ht 66.0 in | Wt 168.0 lb

## 2021-09-21 DIAGNOSIS — M2140 Flat foot [pes planus] (acquired), unspecified foot: Secondary | ICD-10-CM | POA: Insufficient documentation

## 2021-09-21 DIAGNOSIS — M5126 Other intervertebral disc displacement, lumbar region: Secondary | ICD-10-CM | POA: Insufficient documentation

## 2021-09-21 DIAGNOSIS — Z634 Disappearance and death of family member: Secondary | ICD-10-CM | POA: Insufficient documentation

## 2021-09-21 DIAGNOSIS — E041 Nontoxic single thyroid nodule: Secondary | ICD-10-CM

## 2021-09-21 DIAGNOSIS — Z1239 Encounter for other screening for malignant neoplasm of breast: Secondary | ICD-10-CM | POA: Insufficient documentation

## 2021-09-21 DIAGNOSIS — J329 Chronic sinusitis, unspecified: Secondary | ICD-10-CM | POA: Insufficient documentation

## 2021-09-21 DIAGNOSIS — R69 Illness, unspecified: Secondary | ICD-10-CM | POA: Insufficient documentation

## 2021-09-21 DIAGNOSIS — J342 Deviated nasal septum: Secondary | ICD-10-CM

## 2021-09-21 DIAGNOSIS — M47817 Spondylosis without myelopathy or radiculopathy, lumbosacral region: Secondary | ICD-10-CM | POA: Insufficient documentation

## 2021-09-21 DIAGNOSIS — M519 Unspecified thoracic, thoracolumbar and lumbosacral intervertebral disc disorder: Secondary | ICD-10-CM | POA: Insufficient documentation

## 2021-09-21 DIAGNOSIS — M159 Polyosteoarthritis, unspecified: Secondary | ICD-10-CM | POA: Insufficient documentation

## 2021-09-21 DIAGNOSIS — K30 Functional dyspepsia: Secondary | ICD-10-CM | POA: Insufficient documentation

## 2021-09-21 DIAGNOSIS — K829 Disease of gallbladder, unspecified: Secondary | ICD-10-CM | POA: Insufficient documentation

## 2021-09-21 DIAGNOSIS — Z8709 Personal history of other diseases of the respiratory system: Secondary | ICD-10-CM | POA: Insufficient documentation

## 2021-09-21 DIAGNOSIS — J309 Allergic rhinitis, unspecified: Secondary | ICD-10-CM

## 2021-09-21 NOTE — Procedures (Signed)
ENT, [place]  [address]  [place] [address]    Procedure Note    Name: Tonya Francis MRN:  [id number]   Date: 09/21/2021 Age: 66 y.o.       31575 - LARYNGOSCOPY, FLEXIBLE DIAGNOSTIC (AMB ONLY)  Performed by: Dia Sitter, DO  Authorized by: Dia Sitter, DO     Time Out:     Immediately before the procedure, a time out was called:  Yes    Patient verified:  Yes    Procedure Verified:  Yes    Site Verified:  Yes  Documentation:      Indications for procedure: Head / Neck masses    Anesthesia: Oxymetazoline nasal spray    Description: The flexible endoscope was gently introduced into the nostril and passed along the floor of the nose to the nasopharynx. Adenoid was minimal and eustachian tubes normal. The retropalatal airway was patent.    The endoscope was passed to the oropharynx. Base of tongue displayed normal lingual tonsils, patent valelulla, and sharply defined upright epiglottis. Retrolingual airway was patent.    The larynx displayed normal true vocal cords with good mobility. False cords were normal. Arytenoid mucosa was pink with no edema.     The piriform recesses were symmetric without secretion. The hypopharynx was symmetric without lesion.    Findings: Symmetrical true vocal fold motion    The patient tolerated the procedure well.                Dia Sitter, DO

## 2021-09-21 NOTE — H&P (Signed)
ENT, [place]  [address]  [place] [address]  Phone: [phone]  Fax: [phone]      Encounter Date: 09/21/2021    Patient ID: Tonya Francis  MRN: [id number]    DOB: 1956/03/25  Age: 66 y.o. female     Progress Note       Referring Provider:  Garnette Scheuermann, DO    Reason for Visit:   Chief Complaint   Patient presents with   . Follow-up After Testing     1 year rc after thyroid US        History of Present Illness:  Tonya Francis is a 67 y.o. female who is FU on thyroid. Denies dysphagia. Endorses hoarseness related to sinus and allergy problems x 3 weeks. Took an abx with improvement.     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  Left thyroid lobe measures 3.2 cm x 1.0 cm x 0.8 cm.   Isthmus thickness is 0.3 cm.     Thyroid Size: Normal      Background Echotexture: Normal    Thyroid Nodules:  Right upper pole:  Solid, isoechoic, not taller than wide, smooth nodule with no internal echoes measuring 1.5 cm in diameter similar to the previous studies. ACR TI RADS category TR 3    IMPRESSION:  RECOMMENDATION: CONTINUE FOLLOW-UP ULTRASOUND AT 1, 3, AND 5 YEARS. IMAGING CAN STOP AT 5 YEARS, IF THERE IS NO GROWTH ACCORDING TO THE DEFINITION IN THE CITED WHITE PAPER.    Patient History:  Patient Active Problem List   Diagnosis   . Asthma without status asthmaticus   . GERD (gastroesophageal reflux disease)   . Headache   . History of kidney stones   . Hypertension   . Microscopic hematuria   . Recurrent major depressive disorder (CMS HCC)   . Situational anxiety   . Thickened endometrium   . Hypothyroidism   . Tremor   . Vitamin D deficiency   . UTI (urinary tract infection)   .  Cardiac disease   . Acute pain of left knee   . Antibiotic-associated diarrhea   . Bereavement, uncomplicated   . Chronic sinusitis   . Deviated septum   . Disorder of gallbladder   . Disorder of lumbosacral intervertebral disc   . Displacement of lumbar intervertebral disc without myelopathy   . Diverticular disease of colon   . Flat foot   . Generalized osteoarthritis   . History of tonsillitis   . Indigestion   . Lumbosacral spondylosis without myelopathy   . 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 Other ill-defined and unknown causes of morbidity and mortality   . Other screening breast examination   . Polyp of colon     Current Outpatient Medications   Medication Sig   . atenoloL (TENORMIN) 25 mg Oral Tablet Take 1 Tablet (25 mg total) by mouth Once a day   . calcium carbonate (TUMS ORAL) Take 1 Tablet by mouth Once a day   . cyanocobalamin (VITAMIN B12) 1,000 mcg/mL Injection Solution 1 mL (1,000 mcg total) Every 30 days   .  ergocalciferol, vitamin D2, (DRISDOL) 1,250 mcg (50,000 unit) Oral Capsule Take 1 Capsule (50,000 Units total) by mouth Every 7 days   . fluticasone propionate (FLONASE) 50 mcg/actuation Nasal Spray, Suspension Administer 1 Spray into each nostril Twice daily   . Ibuprofen (MOTRIN) 800 mg Oral Tablet Take 1 Tablet (800 mg total) by mouth Three times a day   . lactulose (ENULOSE) 10 gram/15 mL Oral Solution Take 15 mL by mouth Once a day   . levothyroxine (SYNTHROID) 88 mcg Oral Tablet Take 1 Tablet (88 mcg total) by mouth Once a day   . loratadine (CLARITIN) 10 mg Oral Tablet Take 1 Tablet (10 mg total) by mouth Once a day   . multivitamin Oral Tablet Take 1 Tablet by mouth Once a day   . prenatal vitamin-iron-folate Tablet Take 1 Tablet by mouth Once a day      Allergies   Allergen Reactions   . Latex Itching   . Sulfa (Sulfonamides) Swelling and Angioedema     Past Medical History:   Diagnosis Date   . Cardiac disease 07/26/2021   . Disorder of thyroid    .  Esophageal reflux    . Hiatal hernia    . Hypertension    . UTI (urinary tract infection) 07/26/2021     Past Surgical History:   Procedure Laterality Date   . ABDOMINAL SURGERY      BOWEL BLOCKAGE   . DENTAL SURGERY     . HEMORROIDECTOMY     . HX CATARACT REMOVAL     . HX CHOLECYSTECTOMY     . HX CYSTOSCOPY      BIOPSY OF UTERUS   . HX EYE SURGERY     . HX THYROID BIOPSY     . HX TONSILLECTOMY     . HX TUBAL LIGATION     . KNEE ARTHROSCOPY Right    . ORTHOPEDIC SURGERY Right     BROKEN RIGHT ARM   . REPLACEMENT TOTAL KNEE       Family Medical History:     Problem Relation (Age of Onset)    Breast Cancer Sister    COPD Other    Congestive Heart Failure Other    Coronary Artery Disease Father    Elevated Lipids Other    Heart Attack Other    Heart Disease Brother    Hypertension (High Blood Pressure) Father    Kidney Stones Brother    Primary Brain tumor Sister    Stroke Mother          Social History     Tobacco Use   . Smoking status: Never   . Smokeless tobacco: Never   Substance Use Topics   . Alcohol use: Never   . Drug use: Never       Review of Systems:  Review of Systems   Constitutional: Negative.        Physical Exam:  Ht 1.676 m ('5\' 6"'$ )   Wt 76.2 kg (168 lb)   BMI 27.12 kg/m       Physical Exam  Constitutional:       Appearance: Normal appearance. She is well-developed, well-groomed and normal weight.   HENT:      Head: Normocephalic and atraumatic.      Right Ear: Hearing, tympanic membrane, ear canal and external ear normal.      Left Ear: Hearing, tympanic membrane, ear canal and external ear normal.      Nose: Septal deviation and mucosal edema  present.      Right Turbinates: Enlarged.      Left Turbinates: Enlarged.      Mouth/Throat:      Lips: Pink.      Mouth: Mucous membranes are moist.      Pharynx: Oropharynx is clear. Uvula midline.   Eyes:      Extraocular Movements: Extraocular movements intact.   Neck:      Trachea: Phonation normal.   Pulmonary:      Effort: Pulmonary effort is normal.    Musculoskeletal:      Cervical back: Normal range of motion and neck supple.   Lymphadenopathy:      Cervical: No cervical adenopathy.   Skin:     General: Skin is warm.   Neurological:      Mental Status: She is alert and oriented to person, place, and time.      Cranial Nerves: Cranial nerves 2-12 are intact. No facial asymmetry.   Psychiatric:         Attention and Perception: Attention normal.         Mood and Affect: Mood normal.         Speech: Speech normal.         Behavior: Behavior normal. Behavior is cooperative.         Assessment:  ENCOUNTER DIAGNOSES     ICD-10-CM   1. 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 Allergic rhinitis  J30.9   3. 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  No sig change in 1.5cm R thyroid nodule. Cont Flonase and Claritin.   Orders Placed This Encounter   . 99357 - LARYNGOSCOPY, FLEXIBLE DIAGNOSTIC (AMB ONLY)   . US THYROID     No follow-ups on file.    Mathis Dad, PA-C  09/21/2021, 08:37   The advanced practice clinician's documentation was reviewed/amended in its entirety with the assessment and plan portion completely performed independently by me during this separate encounter.

## 2021-09-24 ENCOUNTER — Other Ambulatory Visit: Payer: Self-pay

## 2021-09-24 ENCOUNTER — Other Ambulatory Visit: Payer: Medicare Other | Attending: FAMILY PRACTICE

## 2021-09-24 DIAGNOSIS — E538 Deficiency of other specified B group vitamins: Secondary | ICD-10-CM | POA: Insufficient documentation

## 2021-09-24 DIAGNOSIS — Z13220 Encounter for screening for lipoid disorders: Secondary | ICD-10-CM | POA: Insufficient documentation

## 2021-09-24 DIAGNOSIS — K219 Gastro-esophageal reflux disease without esophagitis: Secondary | ICD-10-CM | POA: Insufficient documentation

## 2021-09-24 DIAGNOSIS — I498 Other specified cardiac arrhythmias: Secondary | ICD-10-CM | POA: Insufficient documentation

## 2021-09-24 DIAGNOSIS — E559 Vitamin D deficiency, unspecified: Secondary | ICD-10-CM | POA: Insufficient documentation

## 2021-09-24 DIAGNOSIS — E039 Hypothyroidism, unspecified: Secondary | ICD-10-CM | POA: Insufficient documentation

## 2021-09-24 LAB — CBC WITH DIFF
BASOPHIL #: 0 10*3/uL (ref 0.00–0.30)
BASOPHIL %: 1 % (ref 0–3)
EOSINOPHIL #: 0.1 10*3/uL (ref 0.00–0.80)
EOSINOPHIL %: 2 % (ref 0–7)
HCT: 42.6 % (ref 37.0–47.0)
HGB: 14.6 g/dL (ref 12.5–16.0)
LYMPHOCYTE #: 1.6 10*3/uL (ref 1.10–5.00)
LYMPHOCYTE %: 26 % (ref 25–45)
MCH: 30.8 pg (ref 27.0–32.0)
MCHC: 34.3 g/dL (ref 32.0–36.0)
MCV: 89.8 fL (ref 78.0–99.0)
MONOCYTE #: 0.5 10*3/uL (ref 0.00–1.30)
MONOCYTE %: 9 % (ref 0–12)
MPV: 8.5 fL (ref 7.4–10.4)
NEUTROPHIL #: 3.7 10*3/uL (ref 1.80–8.40)
NEUTROPHIL %: 62 % (ref 40–76)
PLATELETS: 278 10*3/uL (ref 140–440)
RBC: 4.75 10*6/uL (ref 4.20–5.40)
RDW: 13.9 % (ref 11.6–14.8)
WBC: 6 10*3/uL (ref 4.0–10.5)
WBCS UNCORRECTED: 6 10*3/uL

## 2021-09-24 LAB — LIPID PANEL
CHOL/HDL RATIO: 3.2
CHOLESTEROL: 173 mg/dL (ref <200)
HDL CHOL: 54 mg/dL (ref 23–92)
LDL CALC: 87 mg/dL (ref 0–100)
TRIGLYCERIDES: 160 mg/dL — ABNORMAL HIGH (ref <=150)
VLDL CALC: 32 mg/dL (ref 0–50)

## 2021-09-24 LAB — BASIC METABOLIC PANEL
ANION GAP: 7 mmol/L — ABNORMAL LOW (ref 10–20)
BUN/CREA RATIO: 20 (ref 6–22)
BUN: 16 mg/dL (ref 7–25)
CALCIUM: 9.8 mg/dL (ref 8.6–10.3)
CHLORIDE: 105 mmol/L (ref 98–107)
CO2 TOTAL: 29 mmol/L (ref 21–31)
CREATININE: 0.81 mg/dL (ref 0.60–1.30)
ESTIMATED GFR: 80 mL/min/{1.73_m2} (ref >59)
GLUCOSE: 107 mg/dL (ref 74–109)
OSMOLALITY, CALCULATED: 283 mOsm/kg (ref 270–290)
POTASSIUM: 4.5 mmol/L (ref 3.5–5.1)
SODIUM: 141 mmol/L (ref 136–145)

## 2021-09-24 LAB — URIC ACID: URIC ACID: 5.5 mg/dL (ref 2.3–7.6)

## 2021-09-24 LAB — HEPATIC FUNCTION PANEL
ALBUMIN/GLOBULIN RATIO: 1.5 — ABNORMAL HIGH (ref 0.8–1.4)
ALBUMIN: 4.6 g/dL (ref 3.5–5.7)
ALKALINE PHOSPHATASE: 118 U/L — ABNORMAL HIGH (ref 34–104)
ALT (SGPT): 27 U/L (ref 7–52)
AST (SGOT): 26 U/L (ref 13–39)
BILIRUBIN DIRECT: 0.11 md/dL (ref <=0.20)
BILIRUBIN TOTAL: 0.7 mg/dL (ref 0.3–1.2)
BILIRUBIN, INDIRECT: 0.59 mg/dL (ref <=1)
GLOBULIN: 3.1 (ref 2.9–5.4)
PROTEIN TOTAL: 7.7 g/dL (ref 6.4–8.9)

## 2021-09-24 LAB — THYROID STIMULATING HORMONE (SENSITIVE TSH): TSH: 1.932 u[IU]/mL (ref 0.450–5.330)

## 2021-09-24 LAB — VITAMIN B12: VITAMIN B 12: 463 pg/mL (ref 180–914)

## 2021-09-24 LAB — VITAMIN D 25 TOTAL: VITAMIN D: 39 ng/mL (ref 30–100)

## 2021-09-24 LAB — MAGNESIUM: MAGNESIUM: 2.1 mg/dL (ref 1.9–2.7)

## 2021-09-24 LAB — SEDIMENTATION RATE: ERYTHROCYTE SEDIMENTATION RATE (ESR): 9 mm/hr (ref <30)

## 2021-10-01 ENCOUNTER — Other Ambulatory Visit (HOSPITAL_COMMUNITY): Payer: Self-pay | Admitting: FAMILY PRACTICE

## 2021-10-01 DIAGNOSIS — Z1231 Encounter for screening mammogram for malignant neoplasm of breast: Secondary | ICD-10-CM

## 2021-10-20 IMAGING — DX XRAY WRIST MINIMUM 3 VIEWS RT
1 series · 3 of 3 positions shown · non-contrast
Comparison: None.

﻿EXAM:  [id number]      XRAY WRIST MINIMUM 3 VIEWS RT
INDICATION: Chronic right wrist pain for 3 months.  Swelling.  No known trauma.

[Series 1: PA · 0.14mm/px · 3 of 3 slices shown]
[im 1/3]
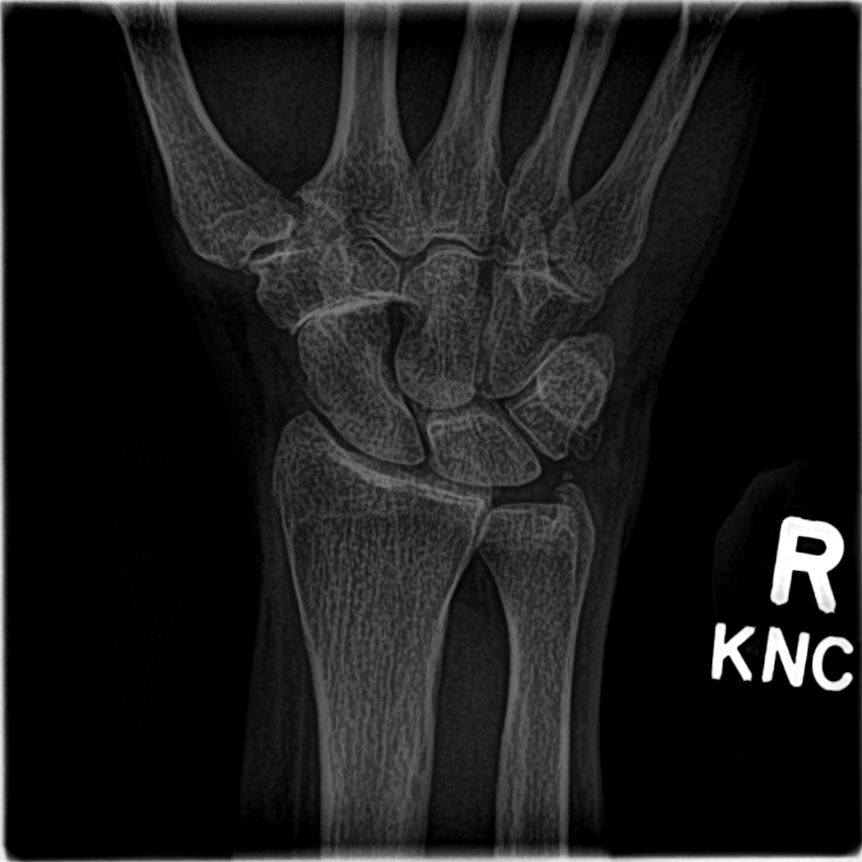
[im 2/3]
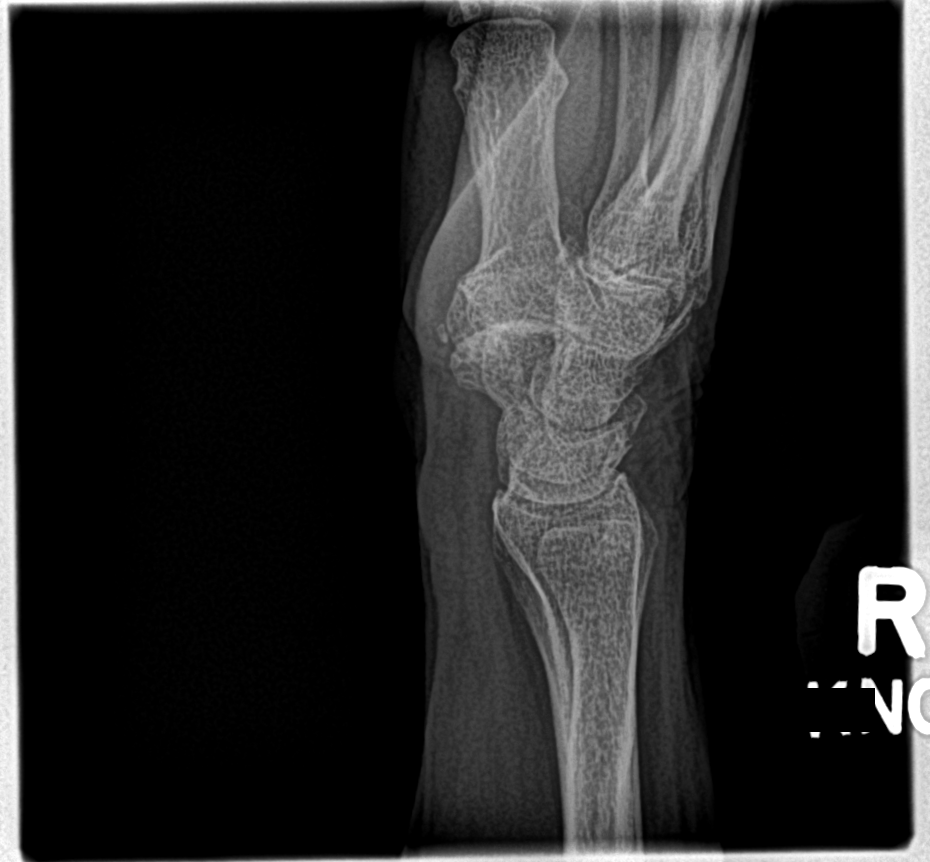
[im 3/3]
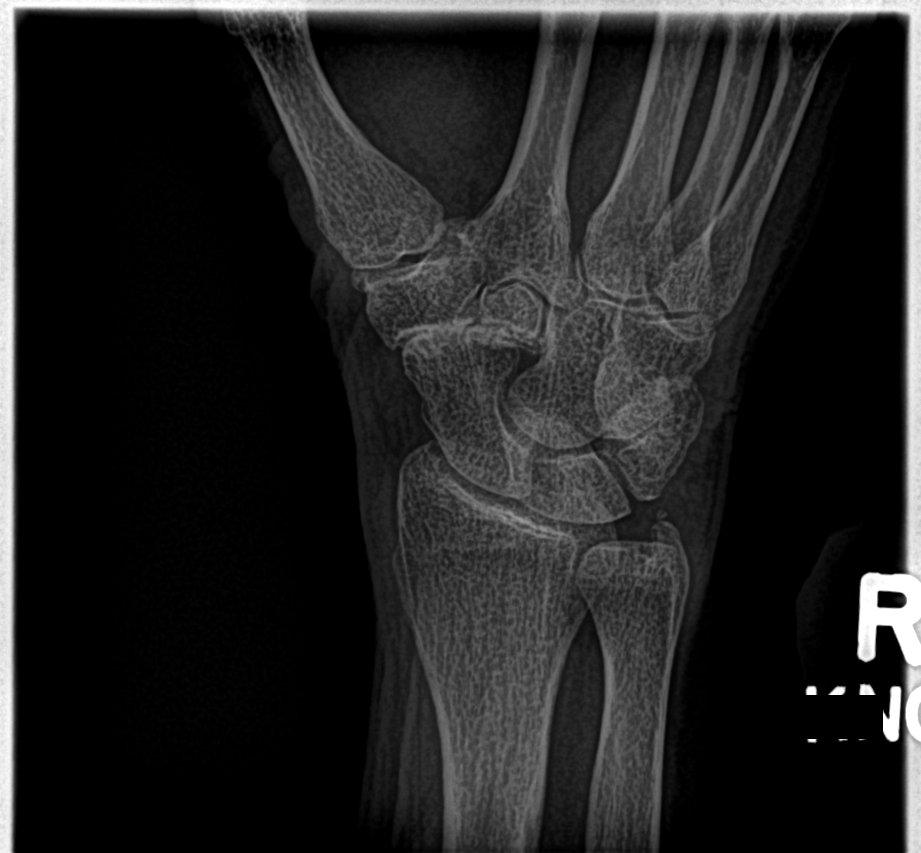

[3 of 3 positions shown; findings below may reference images not displayed]

FINDINGS: No acute bony lesions of the right wrist.  Old chip fracture of tip of the ulnar styloid process.  Mild degenerative arthritis of radiocarpal joint.  Severe degenerative change of the intercarpal joint on the radial aspect of the wrist between the scaphoid and the cuneiform bone.  Soft tissues are unremarkable.
IMPRESSION: 1. No acute findings.  Tiny, old chip fracture of the tip of ulnar styloid process.  

2. Significant degenerative changes of the intercarpal joint on the radial aspect of the wrist as described above.

## 2021-11-01 ENCOUNTER — Other Ambulatory Visit: Payer: Medicare Other | Attending: FAMILY PRACTICE

## 2021-11-01 ENCOUNTER — Other Ambulatory Visit: Payer: Self-pay

## 2021-11-01 DIAGNOSIS — K5900 Constipation, unspecified: Secondary | ICD-10-CM | POA: Insufficient documentation

## 2021-11-01 DIAGNOSIS — E538 Deficiency of other specified B group vitamins: Secondary | ICD-10-CM | POA: Insufficient documentation

## 2021-11-01 DIAGNOSIS — R7989 Other specified abnormal findings of blood chemistry: Secondary | ICD-10-CM | POA: Insufficient documentation

## 2021-11-01 LAB — BASIC METABOLIC PANEL
ANION GAP: 7 mmol/L — ABNORMAL LOW (ref 10–20)
BUN/CREA RATIO: 20 (ref 6–22)
BUN: 16 mg/dL (ref 7–25)
CALCIUM: 9.5 mg/dL (ref 8.6–10.3)
CHLORIDE: 107 mmol/L (ref 98–107)
CO2 TOTAL: 29 mmol/L (ref 21–31)
CREATININE: 0.79 mg/dL (ref 0.60–1.30)
ESTIMATED GFR: 82 mL/min/{1.73_m2} (ref >59)
GLUCOSE: 101 mg/dL (ref 74–109)
OSMOLALITY, CALCULATED: 286 mOsm/kg (ref 270–290)
POTASSIUM: 3.8 mmol/L (ref 3.5–5.1)
SODIUM: 143 mmol/L (ref 136–145)

## 2021-11-01 LAB — HEPATIC FUNCTION PANEL
ALBUMIN/GLOBULIN RATIO: 1.7 — ABNORMAL HIGH (ref 0.8–1.4)
ALBUMIN: 4.5 g/dL (ref 3.5–5.7)
ALKALINE PHOSPHATASE: 107 U/L — ABNORMAL HIGH (ref 34–104)
ALT (SGPT): 20 U/L (ref 7–52)
AST (SGOT): 20 U/L (ref 13–39)
BILIRUBIN DIRECT: 0.16 md/dL (ref <=0.20)
BILIRUBIN TOTAL: 0.8 mg/dL (ref 0.3–1.2)
BILIRUBIN, INDIRECT: 0.64 mg/dL (ref <=1)
GLOBULIN: 2.7 — ABNORMAL LOW (ref 2.9–5.4)
PROTEIN TOTAL: 7.2 g/dL (ref 6.4–8.9)

## 2021-11-01 LAB — MAGNESIUM: MAGNESIUM: 2 mg/dL (ref 1.9–2.7)

## 2021-12-13 ENCOUNTER — Other Ambulatory Visit: Payer: Self-pay

## 2021-12-13 ENCOUNTER — Inpatient Hospital Stay
Admission: RE | Admit: 2021-12-13 | Discharge: 2021-12-13 | Disposition: A | Discharge status: Home / Self Care | Payer: Medicare Other | Source: Ambulatory Visit | Attending: FAMILY PRACTICE | Admitting: FAMILY PRACTICE

## 2021-12-13 ENCOUNTER — Encounter (HOSPITAL_COMMUNITY): Payer: Self-pay

## 2021-12-13 DIAGNOSIS — Z1231 Encounter for screening mammogram for malignant neoplasm of breast: Secondary | ICD-10-CM | POA: Insufficient documentation

## 2021-12-13 HISTORY — DX: Malignant (primary) neoplasm, unspecified: C80.1

## 2021-12-16 ENCOUNTER — Encounter (INDEPENDENT_AMBULATORY_CARE_PROVIDER_SITE_OTHER): Payer: Self-pay | Admitting: INTERVENTIONAL CARDIOLOGY

## 2021-12-20 ENCOUNTER — Other Ambulatory Visit: Payer: Self-pay

## 2021-12-20 ENCOUNTER — Other Ambulatory Visit: Payer: Medicare Other | Attending: FAMILY PRACTICE

## 2021-12-20 DIAGNOSIS — R748 Abnormal levels of other serum enzymes: Secondary | ICD-10-CM | POA: Insufficient documentation

## 2021-12-20 LAB — ALK PHOS (ALKALINE PHOSPHATASE): ALKALINE PHOSPHATASE: 116 U/L — ABNORMAL HIGH (ref 34–104)

## 2021-12-22 ENCOUNTER — Encounter (INDEPENDENT_AMBULATORY_CARE_PROVIDER_SITE_OTHER): Payer: Self-pay | Admitting: NURSE PRACTITIONER

## 2021-12-22 ENCOUNTER — Ambulatory Visit (INDEPENDENT_AMBULATORY_CARE_PROVIDER_SITE_OTHER): Payer: Medicare Other | Admitting: NURSE PRACTITIONER

## 2021-12-22 ENCOUNTER — Other Ambulatory Visit: Payer: Self-pay

## 2021-12-22 VITALS — BP 132/76 | HR 61 | Ht 66.0 in | Wt 164.0 lb

## 2021-12-22 DIAGNOSIS — I10 Essential (primary) hypertension: Secondary | ICD-10-CM

## 2021-12-22 DIAGNOSIS — R002 Palpitations: Secondary | ICD-10-CM

## 2021-12-22 DIAGNOSIS — F411 Generalized anxiety disorder: Secondary | ICD-10-CM

## 2021-12-22 NOTE — Progress Notes (Signed)
Cardiology [facility]    Name: Tonya Francis  Age: 66 y.o.  Date of Service: 12/22/2021    Primary Care Provider: Garnette Scheuermann, DO  Chief Complaint:   Chief Complaint   Patient presents with    Follow Up     Annual     Hypertension    Hyperlipidemia    Palpitations     Expressed she continues to have palpitations and at times becomes diaphoretic with them  and is very concerned about how her family's history of heart disease may affect her        Subjective:  06/24/2020.  Patient presented for initial office visit.  She does not have any history of coronary artery disease.  She has history of hypertension, hypothyroidism, renal stones and family history of coronary artery disease.  She is very active and denies any chest pain or shortness of breath.  She just had very infrequent episodes of palpitation over her course of life which are not bothering her much, as per patient over the last 1 year and half she just had 2 or 3 episodes and is usually happens at night and when she is just sleeping on her left side and with change in position it goes away.  She denies any history of bleeding.  Her blood pressure on today's office visit is elevated, she says that she checks her blood pressure at home and usually are better numbers at home but on elevated side.  Her lipid profile showed cholesterol of 190, LDL 115, HDL 47, triglyceride 140.  She does not smoke.    12-23-20 The patient is here for routine follow-up.  She denies any chest pains or shortness of breath.  Palpitations are improved with increase of atenolol.  Blood pressure still fluctuates sometimes, but this is usually related to stress.  She did have labs in June which showed improvement in her cholesterol numbers, total was 118, triglycerides 132, HDL 47, LDL 45.  A1c 5.5, BUN 15, creatinine 0.7, sodium 142, potassium 4.3.  She was also started on aspirin last visit, she states since then she is bled very easily if she has small  nicks or cuts.  She did start taking this every other day which helped some, but she still bleeds very easily.    12/22/21 The patient is here for yearly f/u.  She reports no specific chest pain or so breath, but she is been having episodes of intense palpitations, usually after she gets out of the shower.  She will become very diaphoretic and weak and symptoms will last seconds to several minutes and then resolve.  She also notices these episodes under high stress situations.  She admits that she is a very anxious person.  She is also concerned because she has family history odd cardiac anomalies.      Past Medical History:  Past Medical History:   Diagnosis Date    Arrhythmia     Cancer (CMS HCC)     basil cell skin    Cardiac disease 07/26/2021    Disorder of thyroid     Dyslipidemia     Esophageal reflux     Fluttering sensation of heart     Hiatal hernia     Hx of renal calculi     Hypertension     Palpitations     Thyroid condition     UTI (urinary tract infection) 07/26/2021         Social History:  Social History     Tobacco Use   Smoking Status Never   Smokeless Tobacco Never      Social History     Substance and Sexual Activity   Alcohol Use Never      Social History     Substance and Sexual Activity   Drug Use Never      Current Medications:  Current Outpatient Medications   Medication Sig    atenoloL (TENORMIN) 25 mg Oral Tablet Take 1 Tablet (25 mg total) by mouth Once a day    calcium carbonate (TUMS ORAL) Take 1 Tablet by mouth Once a day    cholecalciferol, vitamin D3, 1,250 mcg (50,000 unit) Oral Capsule 1 Capsule (50,000 Units total) 2 times weekly    cyanocobalamin (VITAMIN B12) 1,000 mcg/mL Injection Solution 1 mL (1,000 mcg total) Every 2 wks    ergocalciferol, vitamin D2, (DRISDOL) 1,250 mcg (50,000 unit) Oral Capsule Take 1 Capsule (50,000 Units total) by mouth Every 7 days (Patient not taking: Reported on 12/22/2021)    fluticasone propionate (FLONASE) 50 mcg/actuation Nasal Spray, Suspension  Administer 1 Spray into each nostril Twice per day as needed    Ibuprofen (MOTRIN) 800 mg Oral Tablet Take 1 Tablet (800 mg total) by mouth Three times a day    lactulose (ENULOSE) 10 gram/15 mL Oral Solution Take 15 mL by mouth Once a day (Patient not taking: Reported on 12/22/2021)    levothyroxine (SYNTHROID) 88 mcg Oral Tablet Take 1 Tablet (88 mcg total) by mouth Once a day    loratadine (CLARITIN) 10 mg Oral Tablet Take 1 Tablet (10 mg total) by mouth Once a day    montelukast (SINGULAIR) 10 mg Oral Tablet Take 1 Tablet (10 mg total) by mouth Every evening    multivitamin Oral Tablet Take 1 Tablet by mouth Once a day (Patient not taking: Reported on 12/22/2021)    prenatal vitamin-iron-folate Tablet Take 1 Tablet by mouth Once a day     Allergies:  Allergies   Allergen Reactions    Latex Itching    Sulfa (Sulfonamides) Swelling and Angioedema      Review of Systems:  Complete ROS was performed and otherwise negative unless noted in HPI.      Vital Signs:  Vitals:    12/22/21 0942   BP: 132/76   Pulse: 61   SpO2: 97%   Weight: 74.4 kg (164 lb)   Height: 1.676 m ('5\' 6"'$ )   BMI: 26.53      Physical Exam:  General: Pt resting comfortably in no acute distress and appears stated age.    Neck: No JVD, no carotid bruit. Neck supple, symmetrical, trachea midline.   Lungs:  Normal respiratory effort, lungs clear to auscultation bilaterally.    Cardiovascular:  Regular rate and rhythm.  Normal S1 and S2 without murmur, gallop, or rub.  Abdomen: Soft, non-tender and bowel sounds normal.    Extremities: Extremities normal, atraumatic, no cyanosis or edema.    Neurologic: Alert and oriented x3.       Assessment:    Palpitations    Hypertension, unspecified type    GAD (generalized anxiety disorder)      Plan:   Will obtain echo just for baseline, this may also give her some peace of mind.  She does have a lot of interesting family cardiac issues, some seem to be more environmental related, but some can be hereditary.  As long  as echo stable, return in 1 year for routine  follow-up.    Orders placed this visit:  Orders Placed This Encounter    EKG (In-Clinic Today)    TRANSTHORACIC ECHOCARDIOGRAM - ADULT       AJOONI KARAM is to return to clinic for follow up with the understanding that should symptoms change or worsen she is to call the office or go to the closest emergency department for evaluation.    Isabell Jarvis, APRN,FNP-BC    A portion of this documentation may have been generated using MMODAL voice recognition software and may contain syntax/voice recognition errors.

## 2022-01-03 ENCOUNTER — Other Ambulatory Visit: Payer: Medicare Other | Attending: FAMILY PRACTICE

## 2022-01-03 ENCOUNTER — Other Ambulatory Visit: Payer: Self-pay

## 2022-01-03 DIAGNOSIS — E039 Hypothyroidism, unspecified: Secondary | ICD-10-CM | POA: Insufficient documentation

## 2022-01-03 DIAGNOSIS — E785 Hyperlipidemia, unspecified: Secondary | ICD-10-CM | POA: Insufficient documentation

## 2022-01-03 DIAGNOSIS — R7303 Prediabetes: Secondary | ICD-10-CM | POA: Insufficient documentation

## 2022-01-03 DIAGNOSIS — R7989 Other specified abnormal findings of blood chemistry: Secondary | ICD-10-CM | POA: Insufficient documentation

## 2022-01-03 LAB — BASIC METABOLIC PANEL, FASTING
ANION GAP: 8 mmol/L (ref 4–13)
BUN/CREA RATIO: 24 — ABNORMAL HIGH (ref 6–22)
BUN: 20 mg/dL (ref 7–25)
CALCIUM: 9.4 mg/dL (ref 8.6–10.3)
CHLORIDE: 106 mmol/L (ref 98–107)
CO2 TOTAL: 28 mmol/L (ref 21–31)
CREATININE: 0.82 mg/dL (ref 0.60–1.30)
ESTIMATED GFR: 78 mL/min/{1.73_m2} (ref >59)
GLUCOSE: 104 mg/dL (ref 74–109)
OSMOLALITY, CALCULATED: 286 mOsm/kg (ref 270–290)
POTASSIUM: 4 mmol/L (ref 3.5–5.1)
SODIUM: 142 mmol/L (ref 136–145)

## 2022-01-03 LAB — LIPID PANEL
CHOL/HDL RATIO: 3
CHOLESTEROL: 146 mg/dL (ref <200)
HDL CHOL: 48 mg/dL (ref 23–92)
LDL CALC: 54 mg/dL (ref 0–100)
TRIGLYCERIDES: 218 mg/dL — ABNORMAL HIGH (ref <=150)
VLDL CALC: 44 mg/dL (ref 0–50)

## 2022-01-03 LAB — HEPATIC FUNCTION PANEL
ALBUMIN/GLOBULIN RATIO: 1.7 — ABNORMAL HIGH (ref 0.8–1.4)
ALBUMIN: 4.5 g/dL (ref 3.5–5.7)
ALKALINE PHOSPHATASE: 100 U/L (ref 34–104)
ALT (SGPT): 21 U/L (ref 7–52)
AST (SGOT): 17 U/L (ref 13–39)
BILIRUBIN DIRECT: 0.1 md/dL (ref <=0.20)
BILIRUBIN TOTAL: 0.6 mg/dL (ref 0.3–1.2)
BILIRUBIN, INDIRECT: 0.5 mg/dL (ref <=1)
GLOBULIN: 2.7 — ABNORMAL LOW (ref 2.9–5.4)
PROTEIN TOTAL: 7.2 g/dL (ref 6.4–8.9)

## 2022-01-03 LAB — CBC WITH DIFF
BASOPHIL #: 0 10*3/uL (ref 0.00–0.30)
BASOPHIL %: 0 % (ref 0–3)
EOSINOPHIL #: 0 10*3/uL (ref 0.00–0.80)
EOSINOPHIL %: 0 % (ref 0–7)
HCT: 42.6 % (ref 37.0–47.0)
HGB: 14.9 g/dL (ref 12.5–16.0)
LYMPHOCYTE #: 1.7 10*3/uL (ref 1.10–5.00)
LYMPHOCYTE %: 22 % — ABNORMAL LOW (ref 25–45)
MCH: 31.7 pg (ref 27.0–32.0)
MCHC: 35 g/dL (ref 32.0–36.0)
MCV: 90.6 fL (ref 78.0–99.0)
MONOCYTE #: 0.8 10*3/uL (ref 0.00–1.30)
MONOCYTE %: 10 % (ref 0–12)
MPV: 8.7 fL (ref 7.4–10.4)
NEUTROPHIL #: 5.2 10*3/uL (ref 1.80–8.40)
NEUTROPHIL %: 68 % (ref 40–76)
PLATELETS: 248 10*3/uL (ref 140–440)
RBC: 4.7 10*6/uL (ref 4.20–5.40)
RDW: 13.7 % (ref 11.6–14.8)
WBC: 7.7 10*3/uL (ref 4.0–10.5)
WBCS UNCORRECTED: 7.7 10*3/uL

## 2022-01-03 LAB — HGA1C (HEMOGLOBIN A1C WITH EST AVG GLUCOSE): HEMOGLOBIN A1C: 5.4 % (ref 4.0–6.0)

## 2022-01-03 LAB — THYROID STIMULATING HORMONE (SENSITIVE TSH): TSH: 1.083 u[IU]/mL (ref 0.450–5.330)

## 2022-01-03 LAB — VITAMIN D 25 TOTAL: VITAMIN D: 81 ng/mL (ref 30–100)

## 2022-01-03 LAB — MAGNESIUM: MAGNESIUM: 2 mg/dL (ref 1.9–2.7)

## 2022-01-05 ENCOUNTER — Other Ambulatory Visit: Payer: Self-pay

## 2022-01-05 ENCOUNTER — Inpatient Hospital Stay
Admission: RE | Admit: 2022-01-05 | Discharge: 2022-01-05 | Disposition: A | Discharge status: Home / Self Care | Payer: Medicare Other | Source: Ambulatory Visit | Attending: NURSE PRACTITIONER | Admitting: NURSE PRACTITIONER

## 2022-01-05 DIAGNOSIS — R002 Palpitations: Secondary | ICD-10-CM | POA: Insufficient documentation

## 2022-01-05 DIAGNOSIS — I10 Essential (primary) hypertension: Secondary | ICD-10-CM | POA: Insufficient documentation

## 2022-01-10 DIAGNOSIS — R002 Palpitations: Secondary | ICD-10-CM

## 2022-01-10 DIAGNOSIS — I10 Essential (primary) hypertension: Secondary | ICD-10-CM

## 2022-01-24 ENCOUNTER — Encounter (INDEPENDENT_AMBULATORY_CARE_PROVIDER_SITE_OTHER): Payer: Self-pay | Admitting: Family

## 2022-02-02 ENCOUNTER — Other Ambulatory Visit: Payer: Self-pay

## 2022-02-02 ENCOUNTER — Other Ambulatory Visit: Payer: Medicare Other | Attending: FAMILY PRACTICE

## 2022-02-02 DIAGNOSIS — N760 Acute vaginitis: Secondary | ICD-10-CM | POA: Insufficient documentation

## 2022-02-02 DIAGNOSIS — F321 Major depressive disorder, single episode, moderate: Secondary | ICD-10-CM | POA: Insufficient documentation

## 2022-02-02 DIAGNOSIS — R319 Hematuria, unspecified: Secondary | ICD-10-CM | POA: Insufficient documentation

## 2022-02-02 DIAGNOSIS — L259 Unspecified contact dermatitis, unspecified cause: Secondary | ICD-10-CM | POA: Insufficient documentation

## 2022-02-02 DIAGNOSIS — E039 Hypothyroidism, unspecified: Secondary | ICD-10-CM | POA: Insufficient documentation

## 2022-02-02 LAB — CBC WITH DIFF
BASOPHIL #: 0 10*3/uL (ref 0.00–0.10)
BASOPHIL %: 0 % (ref 0–1)
EOSINOPHIL #: 0 10*3/uL (ref 0.00–0.50)
EOSINOPHIL %: 1 %
HCT: 42.5 % — ABNORMAL HIGH (ref 31.2–41.9)
HGB: 14.7 g/dL — ABNORMAL HIGH (ref 10.9–14.3)
LYMPHOCYTE #: 1.7 10*3/uL (ref 1.00–3.00)
LYMPHOCYTE %: 23 % (ref 16–44)
MCH: 31.9 pg (ref 24.7–32.8)
MCHC: 34.5 g/dL (ref 32.3–35.6)
MCV: 92.3 fL (ref 75.5–95.3)
MONOCYTE #: 0.6 10*3/uL (ref 0.30–1.00)
MONOCYTE %: 8 % (ref 5–13)
MPV: 8.4 fL (ref 7.9–10.8)
NEUTROPHIL #: 4.9 10*3/uL (ref 1.85–7.80)
NEUTROPHIL %: 68 % (ref 43–77)
PLATELETS: 283 10*3/uL (ref 140–440)
RBC: 4.6 10*6/uL (ref 3.63–4.92)
RDW: 14.1 % (ref 12.3–17.7)
WBC: 7.3 10*3/uL (ref 3.8–11.8)

## 2022-02-02 LAB — THYROID STIMULATING HORMONE (SENSITIVE TSH): TSH: 1.184 u[IU]/mL (ref 0.450–5.330)

## 2022-02-02 LAB — LDH: LDH: 220 U/L (ref 140–271)

## 2022-02-02 LAB — URINALYSIS, MICROSCOPIC
NON-SQUAMOUS EPITHELIAL CELLS URINE: <1 /hpf (ref <=1)
RBCS: <1 /hpf (ref <4)

## 2022-02-04 LAB — URINE CULTURE: URINE CULTURE: 3000 — AB

## 2022-02-17 ENCOUNTER — Ambulatory Visit (INDEPENDENT_AMBULATORY_CARE_PROVIDER_SITE_OTHER): Payer: Medicare Other | Admitting: Urology

## 2022-02-17 ENCOUNTER — Other Ambulatory Visit: Payer: Self-pay

## 2022-02-17 ENCOUNTER — Encounter (INDEPENDENT_AMBULATORY_CARE_PROVIDER_SITE_OTHER): Payer: Self-pay | Admitting: Urology

## 2022-02-17 ENCOUNTER — Other Ambulatory Visit: Payer: Medicare Other | Attending: Urology | Admitting: Urology

## 2022-02-17 VITALS — BP 138/77 | HR 61 | Ht 68.0 in | Wt 159.0 lb

## 2022-02-17 DIAGNOSIS — N390 Urinary tract infection, site not specified: Secondary | ICD-10-CM

## 2022-02-17 DIAGNOSIS — R319 Hematuria, unspecified: Secondary | ICD-10-CM | POA: Insufficient documentation

## 2022-02-17 DIAGNOSIS — N200 Calculus of kidney: Secondary | ICD-10-CM

## 2022-02-17 NOTE — Progress Notes (Signed)
[facility]  UROLOGY, [place]  [place] [address]  [phone]   Urology Progress Note    Date of Service:  02/17/2022  Tonya Francis, 66 y.o. female  Date of Birth:  03/09/1956  PCP: Garnette Scheuermann, DO  Referring:  No ref. provider found     HPI:  Tonya Francis is a 66 y.o. White female with history of UTIs, microscopic hematuria, and kidney stones returns today for follow up, last seen by NP 07/26/2021.  Patient denies any interim urologic issues or new complaints.  Voiding well without difficulty or bothersome urinary symptoms, denies dysuria or hematuria.  Last UTI was several years ago.  Last kidney stone episode was also years ago, denies any recent flank pain or signs/symptoms otherwise concerning for recurrent stone episode.  No surveillance imaging in some time.  Last cystoscopy was in 2017 by Dr. Charlies Silvers or hematuria.  Of note, she is presently on doxycycline reportedly for folliculitis.    Past Medical History:   Diagnosis Date    Arrhythmia     Cancer (CMS HCC)     basil cell skin    Cardiac disease 07/26/2021    Disorder of thyroid     Dyslipidemia     Esophageal reflux     Fluttering sensation of heart     Hiatal hernia     Hx of renal calculi     Hypertension     Palpitations     Thyroid condition     UTI (urinary tract infection) 07/26/2021      Past Surgical History:   Procedure Laterality Date    ABDOMINAL SURGERY      BOWEL BLOCKAGE    DENTAL SURGERY      HEMORROIDECTOMY      HX CATARACT REMOVAL      HX CHOLECYSTECTOMY      HX CYSTOSCOPY      BIOPSY OF UTERUS    HX EYE SURGERY      HX THYROID BIOPSY      HX TONSILLECTOMY      HX TUBAL LIGATION      KNEE ARTHROSCOPY Right     ORTHOPEDIC SURGERY Right     BROKEN RIGHT ARM    REPLACEMENT TOTAL KNEE        Social History     Tobacco Use    Smoking status: Never    Smokeless tobacco: Never   Substance Use Topics    Alcohol use: Never    Drug use: Never       Family  Medical History:       Problem Relation (Age of Onset)    Breast Cancer Sister    COPD Other    Congestive Heart Failure Other    Coronary Artery Disease Father    Elevated Lipids Other    Heart Attack Other    Heart Disease Brother    Hypertension (High Blood Pressure) Father    Kidney Stones Brother    No Known Problems Maternal Aunt, Maternal Uncle, Paternal 10, Paternal Uncle, Maternal Grandmother, Maternal Grandfather, Paternal 35, Paternal 61, Daughter, Son    Primary Brain tumor Sister    Stroke Mother           Outpatient Medications Marked as Taking for the 02/17/22 encounter (Office Visit) with Franchot Gallo, MD   Medication Sig    doxycycline hyclate (VIBRAMYCIN) 100 mg Oral Capsule     mupirocin (  BACTROBAN) 2 % Ointment       Allergies   Allergen Reactions    Latex Itching    Sulfa (Sulfonamides) Swelling and Angioedema        Review of Systems:   As discussed in HPI       Physical Exam:   BP 138/77 (Site: Right, Patient Position: Sitting, Cuff Size: Adult)   Pulse 61   Ht 1.727 m ('5\' 8"'$ )   Wt 72.1 kg (159 lb)   BMI 24.18 kg/m     General: AAO, NAD  HEENT: NCAT, EOMI  Heart: Regular rate. No peripheral edema.  Lungs:  Nonlabored breathing  Abdomen: Soft, nontender, nondistended.   Extremities: Normal range of motion  Skin: Warm and dry    Neurologic: Speech intact, no deficits noted   Psychiatric: Intact judgment and insight are intact. Appropriate mood and affect.  Genitourinary:  No flank tenderness bilaterally.  No suprapubic fullness or tenderness.        Urinalysis (automated dipstick):    Urine Dip Results:  Color (Ref Range: Yellow): Yellow (02/17/22 0800)  Clarity (Ref Range: Clear): Clear (02/17/22 0800)  Time collected: 0805 (02/17/22 0800)  Glucose (Ref Range: Negative mg/dL): Negative (02/17/22 0800)  Bilirubin (Ref Range: Negative mg/dL): Negative (02/17/22 0800)  Ketones (Ref Range: Negative mg/dL): Negative (02/17/22 0800)  Urine Specific Gravity (Ref Range: 1.005 -  1.030): 1.020 (02/17/22 0800)  Blood (urine) (Ref Range: Negative mg/dL): (!) Small (1+) (02/17/22 0800)  pH (Ref Range: 5.0 - 8.0): 5.5 (02/17/22 0800)  Protein (Ref Range: Negative mg/dL): Negative (02/17/22 0800)  Urobilinogen (Ref Range: Negative mg/dL): 0.'2mg'$ /dL (Normal) (02/17/22 0800)  Nitrite (Ref Range: Negative): Negative (02/17/22 0800)  Leukocytes (Ref Range: Negative WBC's/uL): (!) Small (02/17/22 0800)      Post-void residual, by bladder scan:  PVR Volume: 0 (02/17/22 0800)        Data Review:  Pertinent laboratory data and imaging studies reviewed.        ----------------------------------------------------------------------------------------------------------  Historic results, copied from prior documentation:  ----------------------------------------------------------------------------------------------------------  Urine culture 05/02/15: 50,000-100,000 CFU, E. coli  Urine culture 06/06/15: > 75,000-100,000 CFU, E. coli  Urine culture 06/30/15: negative     UA 12/2014: < 1 RBC/hpf  UA 03/2015: 3 RBCs/hpf  UA 06/04/15: 4 RBCs/hpf  UA 06/30/15: 1 RBC/hpf     06/30/15 cytology: mild inflammation and scattered RBCs     07/13/15 CT urogram: 4 mm left renal stone, bilateral renal cysts including peripelvic on the left; small hiatal hernia, heterogeneous uterus with a possible prominent endometrial stripe; serpiginous left periuterine structure; L5-S1 disc space narrowing     She underwent cystoscopy 08/18/15. There were no stones, tumors, or areas of inflammation within the lower urinary tract.     01/2017 KUB showed a small left renal stone (unchanged).     11/12/2018 UA showed trace blood. Urine culture showed contamination and cytology was negative for malignancy.     CT abd and pelvis with contrast 05/21/19: bilateral peripelvic cysts (L > R), no obstruction.     01/27/20 UA showed blood. Urine cytology 01/27/20 was negative.     UA 07/26/21 showed  blood  ----------------------------------------------------------------------------------------------------------        Assessment:  Assessment/Plan   1. Hematuria, unspecified type    2. Kidney stone    3. Recurrent UTI       Patient doing well, no acute issues.  No PVR on bladder scan.    Plan:  --urinalysis with small blood and small leukocytes,  sending for prophylactic culture   >> advised that treatment would not be required as she is asymptomatic but would be used to guide future empiric therapy if developing signs or symptoms concerning for infection, call office if occurring to additionally obtain prompt repeat urine testing prior to initiating empiric antibiotics    --renal ultrasound for stone surveillance   >> advised that she would then be ordered for CT if found to have any upper tract dilatation  >> reviewed signs/symptoms which may reflect development of stone obstruction and for which she should call Urology office with mild-to-moderate symptoms versus present to ER with severe and/or red flag symptoms    Follow up in 1 year unless any interim issues as above.        Franchot Gallo, MD    This note may have been fully or partially generated using MModal Fluency Direct system, and there may be some incorrect words, spellings, and punctuation that were not identified in checking the note before saving.

## 2022-02-18 ENCOUNTER — Inpatient Hospital Stay
Admission: RE | Admit: 2022-02-18 | Discharge: 2022-02-18 | Disposition: A | Discharge status: Home / Self Care | Payer: Medicare Other | Source: Ambulatory Visit | Attending: Urology | Admitting: Urology

## 2022-02-18 DIAGNOSIS — R319 Hematuria, unspecified: Secondary | ICD-10-CM | POA: Insufficient documentation

## 2022-02-18 DIAGNOSIS — N200 Calculus of kidney: Secondary | ICD-10-CM | POA: Insufficient documentation

## 2022-02-19 LAB — URINE CULTURE: URINE CULTURE: 50000 — AB

## 2022-03-09 LAB — ECG W INTERP (AMB USE ONLY)(MUSE,IN CLINIC)
Atrial Rate: 61 {beats}/min
Calculated P Axis: 76 degrees
Calculated R Axis: -9 degrees
Calculated T Axis: 71 degrees
PR Interval: 140 ms
QRS Duration: 78 ms
QT Interval: 446 ms
QTC Calculation: 448 ms
Ventricular rate: 61 {beats}/min

## 2022-06-01 ENCOUNTER — Other Ambulatory Visit: Payer: Self-pay

## 2022-06-01 ENCOUNTER — Other Ambulatory Visit: Payer: Medicare Other | Attending: FAMILY PRACTICE

## 2022-06-01 DIAGNOSIS — E039 Hypothyroidism, unspecified: Secondary | ICD-10-CM | POA: Insufficient documentation

## 2022-06-01 DIAGNOSIS — T7840XA Allergy, unspecified, initial encounter: Secondary | ICD-10-CM | POA: Insufficient documentation

## 2022-06-01 DIAGNOSIS — I10 Essential (primary) hypertension: Secondary | ICD-10-CM | POA: Insufficient documentation

## 2022-06-01 DIAGNOSIS — Z0000 Encounter for general adult medical examination without abnormal findings: Secondary | ICD-10-CM | POA: Insufficient documentation

## 2022-06-01 DIAGNOSIS — R319 Hematuria, unspecified: Secondary | ICD-10-CM | POA: Insufficient documentation

## 2022-06-01 LAB — CBC WITH DIFF
BASOPHIL #: 0 10*3/uL (ref 0.00–0.10)
BASOPHIL %: 1 % (ref 0–1)
EOSINOPHIL #: 0.1 10*3/uL (ref 0.00–0.50)
EOSINOPHIL %: 2 %
HCT: 41.9 % (ref 31.2–41.9)
HGB: 14.1 g/dL (ref 10.9–14.3)
LYMPHOCYTE #: 1.2 10*3/uL (ref 1.00–3.00)
LYMPHOCYTE %: 17 % (ref 16–44)
MCH: 31.4 pg (ref 24.7–32.8)
MCHC: 33.7 g/dL (ref 32.3–35.6)
MCV: 93.1 fL (ref 75.5–95.3)
MONOCYTE #: 0.6 10*3/uL (ref 0.30–1.00)
MONOCYTE %: 8 % (ref 5–13)
MPV: 9 fL (ref 7.9–10.8)
NEUTROPHIL #: 5.2 10*3/uL (ref 1.85–7.80)
NEUTROPHIL %: 73 % (ref 43–77)
PLATELETS: 240 10*3/uL (ref 140–440)
RBC: 4.5 10*6/uL (ref 3.63–4.92)
RDW: 13.7 % (ref 12.3–17.7)
WBC: 7.1 10*3/uL (ref 3.8–11.8)

## 2022-06-01 LAB — HEPATIC FUNCTION PANEL
ALBUMIN/GLOBULIN RATIO: 1.8 — ABNORMAL HIGH (ref 0.8–1.4)
ALBUMIN: 4.6 g/dL (ref 3.5–5.7)
ALKALINE PHOSPHATASE: 103 U/L (ref 34–104)
ALT (SGPT): 21 U/L (ref 7–52)
AST (SGOT): 20 U/L (ref 13–39)
BILIRUBIN DIRECT: 0.11 md/dL (ref <=0.20)
BILIRUBIN TOTAL: 0.8 mg/dL (ref 0.3–1.2)
BILIRUBIN, INDIRECT: 0.69 mg/dL (ref <=1)
GLOBULIN: 2.5 — ABNORMAL LOW (ref 2.9–5.4)
PROTEIN TOTAL: 7.1 g/dL (ref 6.4–8.9)

## 2022-06-01 LAB — BASIC METABOLIC PANEL, FASTING
ANION GAP: 6 mmol/L (ref 4–13)
BUN/CREA RATIO: 17 (ref 6–22)
BUN: 13 mg/dL (ref 7–25)
CALCIUM: 9.6 mg/dL (ref 8.6–10.3)
CHLORIDE: 107 mmol/L (ref 98–107)
CO2 TOTAL: 28 mmol/L (ref 21–31)
CREATININE: 0.76 mg/dL (ref 0.60–1.30)
ESTIMATED GFR: 85 mL/min/{1.73_m2} (ref >59)
GLUCOSE: 100 mg/dL (ref 74–109)
OSMOLALITY, CALCULATED: 282 mOsm/kg (ref 270–290)
POTASSIUM: 4 mmol/L (ref 3.5–5.1)
SODIUM: 141 mmol/L (ref 136–145)

## 2022-06-01 LAB — LIPID PANEL
CHOL/HDL RATIO: 3.1
CHOLESTEROL: 154 mg/dL (ref <200)
HDL CHOL: 49 mg/dL (ref 23–92)
LDL CALC: 67 mg/dL (ref 0–100)
TRIGLYCERIDES: 191 mg/dL — ABNORMAL HIGH (ref <=150)
VLDL CALC: 38 mg/dL (ref 0–50)

## 2022-06-03 LAB — URINE CULTURE: URINE CULTURE: NO GROWTH

## 2022-06-14 ENCOUNTER — Other Ambulatory Visit (HOSPITAL_COMMUNITY): Payer: Self-pay | Admitting: FAMILY PRACTICE

## 2022-06-14 DIAGNOSIS — R198 Other specified symptoms and signs involving the digestive system and abdomen: Secondary | ICD-10-CM

## 2022-06-14 DIAGNOSIS — Z780 Asymptomatic menopausal state: Secondary | ICD-10-CM

## 2022-06-14 DIAGNOSIS — M8580 Other specified disorders of bone density and structure, unspecified site: Secondary | ICD-10-CM

## 2022-06-14 DIAGNOSIS — Z1231 Encounter for screening mammogram for malignant neoplasm of breast: Secondary | ICD-10-CM

## 2022-06-16 ENCOUNTER — Other Ambulatory Visit: Payer: Self-pay

## 2022-06-16 ENCOUNTER — Inpatient Hospital Stay
Admission: RE | Admit: 2022-06-16 | Discharge: 2022-06-16 | Disposition: A | Discharge status: Home / Self Care | Payer: Medicare Other | Source: Ambulatory Visit | Attending: FAMILY PRACTICE

## 2022-06-16 DIAGNOSIS — R198 Other specified symptoms and signs involving the digestive system and abdomen: Secondary | ICD-10-CM

## 2022-06-20 ENCOUNTER — Other Ambulatory Visit: Payer: Self-pay

## 2022-06-20 ENCOUNTER — Other Ambulatory Visit: Payer: Medicare Other | Attending: FAMILY PRACTICE

## 2022-06-20 DIAGNOSIS — E278 Other specified disorders of adrenal gland: Secondary | ICD-10-CM

## 2022-06-20 DIAGNOSIS — Z01419 Encounter for gynecological examination (general) (routine) without abnormal findings: Secondary | ICD-10-CM

## 2022-06-20 LAB — CA 125: CA 125: 15.4 U/mL (ref <=35.0)

## 2022-06-25 LAB — THIN PREP PAP AND HPV MRNA (QUEST): HPV MRNA E6/E7: NOT DETECTED

## 2022-06-29 IMAGING — MR MRI PELVIS WITHOUT AND WITH CONTRAST
4 of 9 series · 21 of 48 positions shown · IV contrast (gadolinium)
Comparison: Outside ultrasound examination dated 06/16/2022, transabdominal and transvaginal pelvic ultrasound.  Earlier CT pelvis performed year with oral and IV contrast on 06/22/2021.

﻿EXAM:  08010   MRI PELVIS WITHOUT AND WITH CONTRAST
INDICATION: 66-year-old female was evaluated at outside facility with pelvic ultrasound on 06/16/2022 showing abnormal finding regarding the cervix, possible mass not excluded.  A 13 mm size lesion of the right pelvic adnexa was also noted on ultrasound.  No mention of vaginal bleeding.  Prior surgery for bowel obstruction in 7225.  No prior malignancy. Pelvic pain.
TECHNIQUE: Multiplanar, multisequential MRI of the pelvis was performed without gadolinium contrast followed by postcontrast images after 10 mL Gadavist IV.

[Series 7: T1 · axial · 6.0mm · 0.83mm/px · z∈[-55,+148]mm · 6 of 30 slices shown (1 of 2)]
[im 1/30]
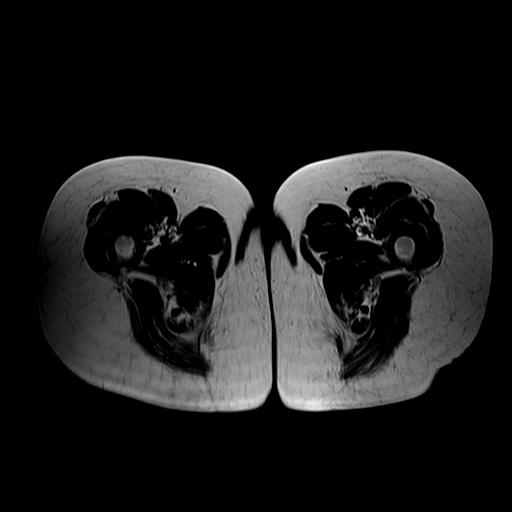
[im 6/30]
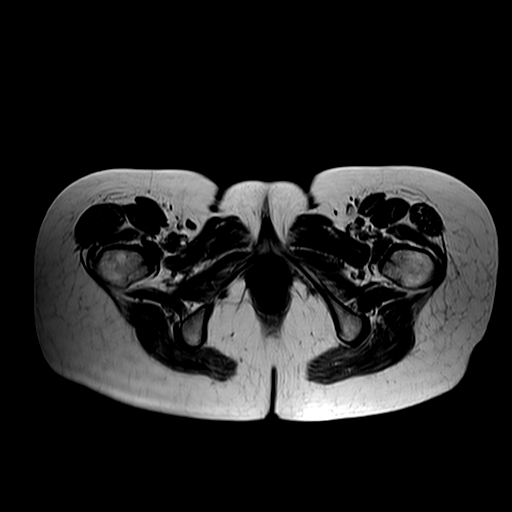
[im 12/30]
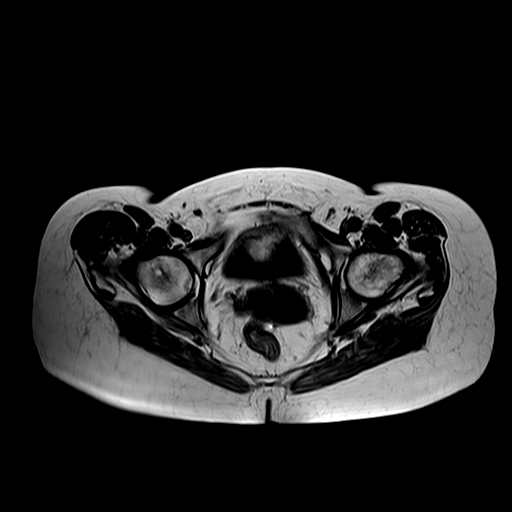
[im 18/30]
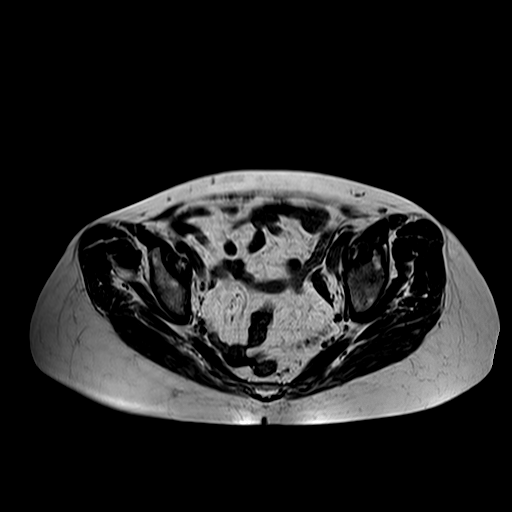
[im 24/30]
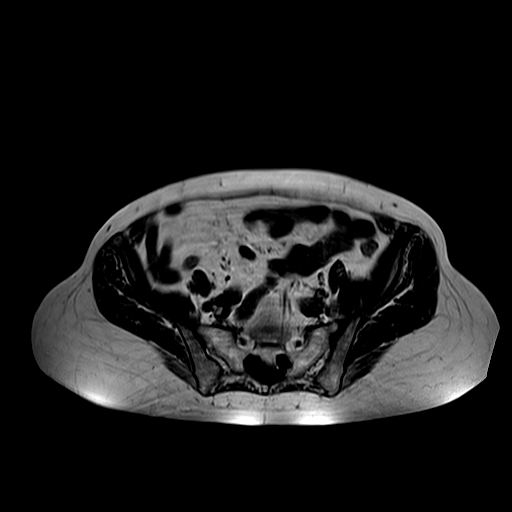
[im 30/30]
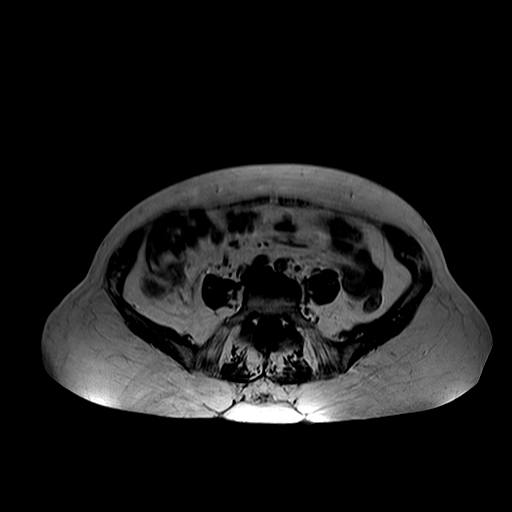

[Series 8: STIR · axial · 6.0mm · 0.83mm/px · z∈[-55,+148]mm · 5 of 30 slices shown]
[im 1/30]
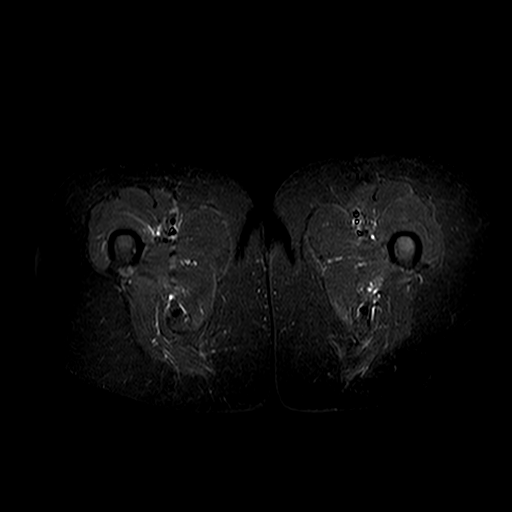
[im 8/30]
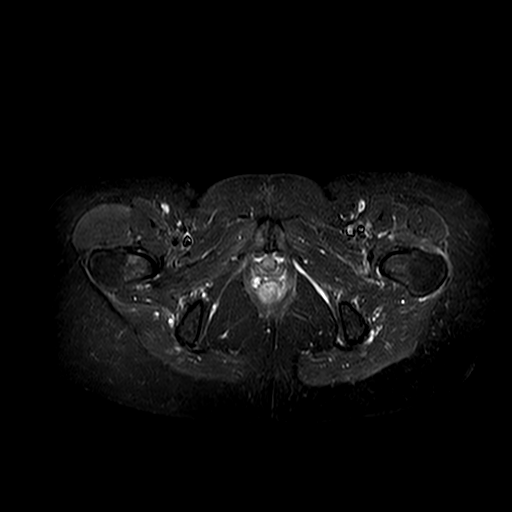
[im 15/30]
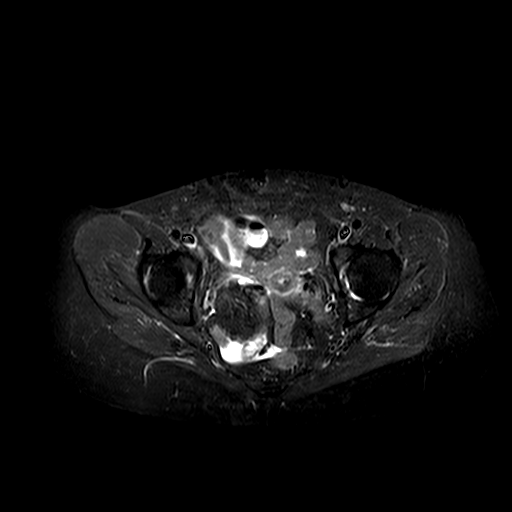
[im 22/30]
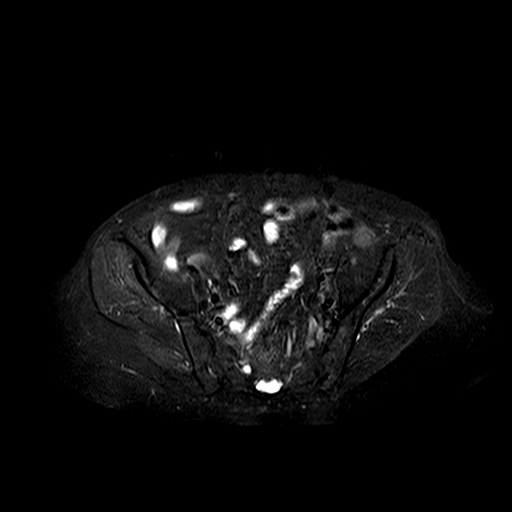
[im 30/30]
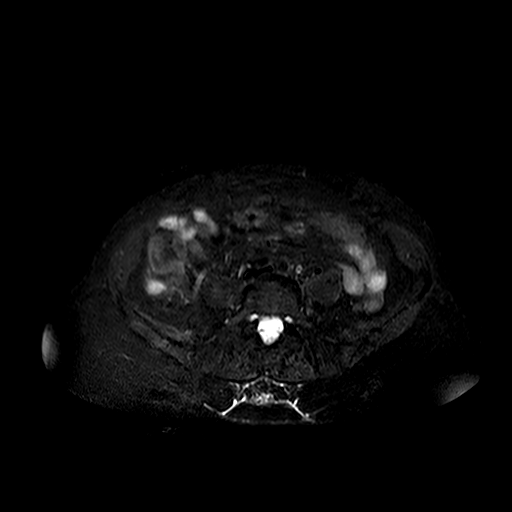

[Series 9: T2 fat-sat · axial · 6.0mm · 1.33mm/px · z∈[-55,+148]mm · 5 of 30 slices shown]
[im 1/30]
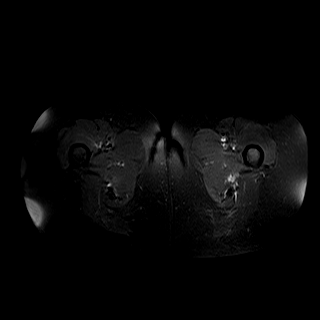
[im 8/30]
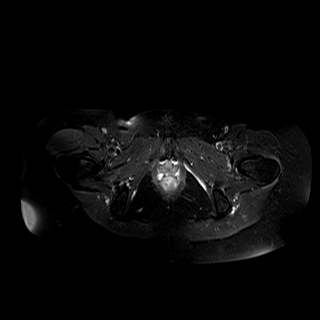
[im 15/30]
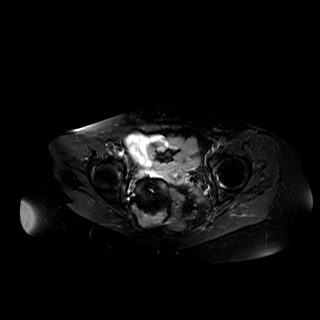
[im 22/30]
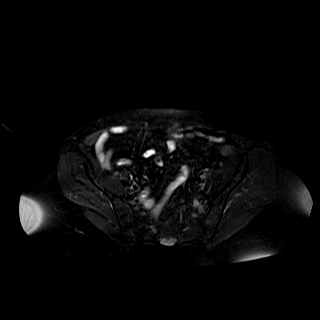
[im 30/30]
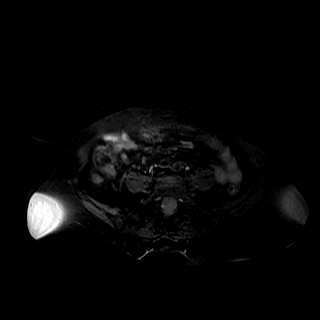

[Series 10: T1 · coronal · 7.0mm · 0.83mm/px · 5 of 28 slices shown (2 of 2)]
[im 1/28]
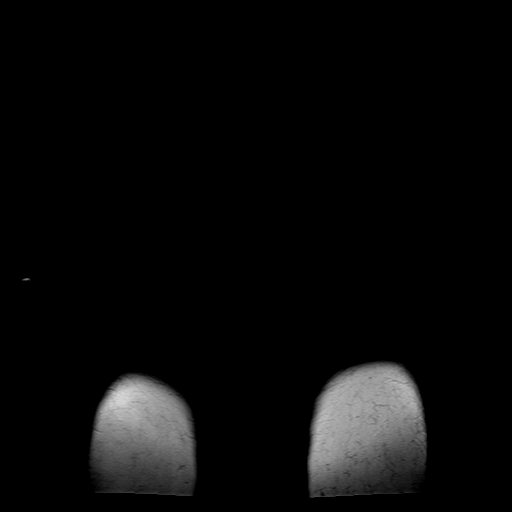
[im 7/28]
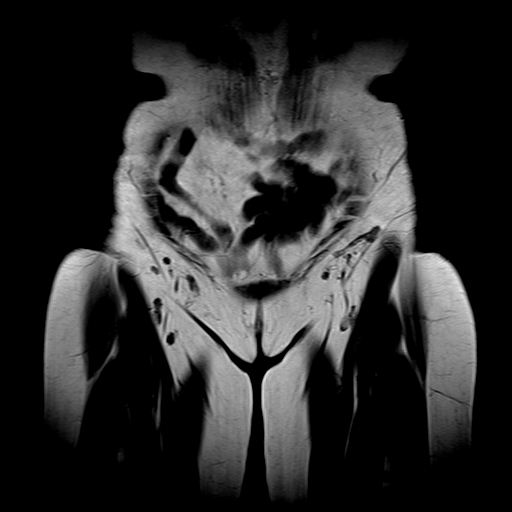
[im 14/28]
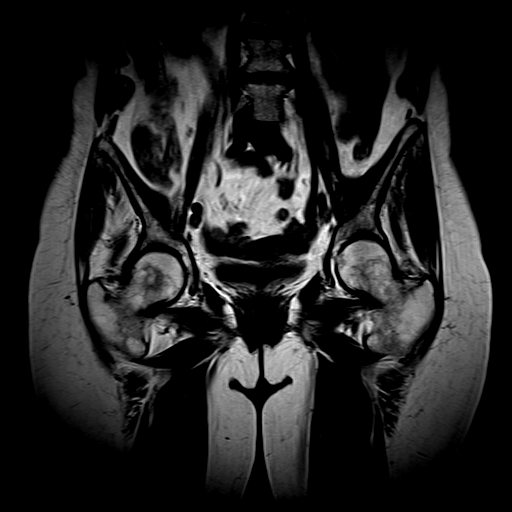
[im 21/28]
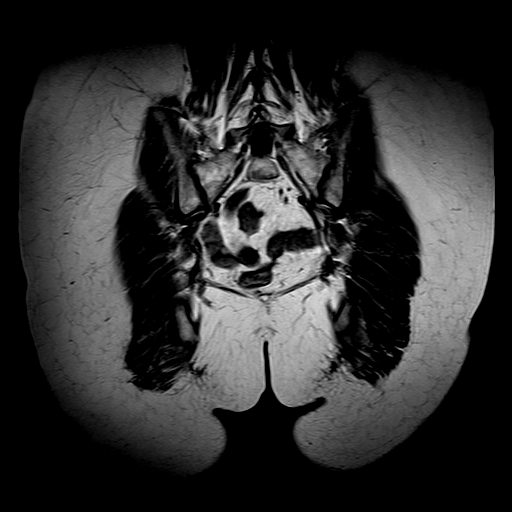
[im 28/28]
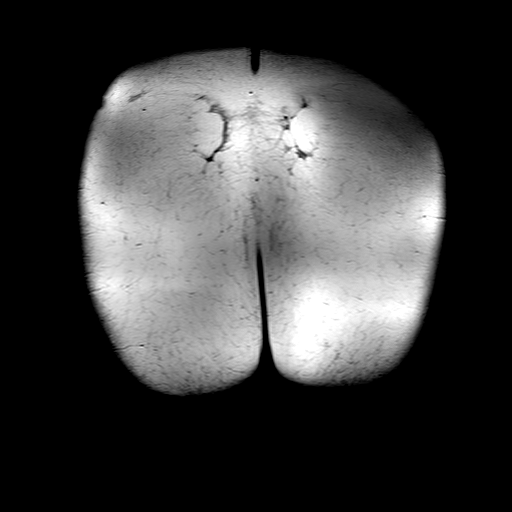

[21 of 48 positions shown; findings below may reference images not displayed]

FINDINGS: Postmenopausal size uterus is noted measuring a length of 5.3 cm and a width of 2.2 cm.  There is no evidence of endometrial thickening.

Stable enlargement of the cervix is noted measuring 3.2 cm in diameter, similar in appearance to the CT examination on 06/22/2021.  Small Nabothian cyst is noted. No infiltrating mass is noted in the paracervical location.

Postmenopausal size ovaries are noted with the right ovary measuring 1.5 x 2 cm in diameter and the left ovary 1.5 x 2 cm in diameter.  No definite pelvic adnexal mass or lymphadenopathy or fluid collections are seen.  No free fluid is noted in the pelvis. 

No focal bone changes of the pelvis are seen other than mild to moderate osteoarthritis of hip joints.
IMPRESSION: 1. Normal postmenopausal size anteverted uterus.  No endometrial thickening.

2. Nonspecific thickening of the cervix, 3 cm in diameter with small Nabothian cyst.  No definite pericervical infiltrating mass or enhancement is seen.  Correlation with GYN pelvic examination and PAP smear are recommended.

3. No adnexal mass is noted. Postmenopausal size ovaries are noted. 

4. If pelvic examination and PAP smear are negative, routine follow-up by transabdominal and transvaginal ultrasound is recommended in 6 months. Alternatively, CT scan with contrast would be a good follow-up tool.

## 2022-08-02 ENCOUNTER — Ambulatory Visit (INDEPENDENT_AMBULATORY_CARE_PROVIDER_SITE_OTHER): Payer: Medicare Other | Admitting: Surgery

## 2022-08-02 ENCOUNTER — Other Ambulatory Visit (INDEPENDENT_AMBULATORY_CARE_PROVIDER_SITE_OTHER): Payer: Self-pay | Admitting: Surgery

## 2022-08-02 ENCOUNTER — Other Ambulatory Visit: Payer: Self-pay

## 2022-08-02 ENCOUNTER — Encounter (INDEPENDENT_AMBULATORY_CARE_PROVIDER_SITE_OTHER): Payer: Self-pay | Admitting: Surgery

## 2022-08-02 VITALS — BP 139/61 | HR 67 | Temp 96.8°F | Ht 66.0 in | Wt 163.0 lb

## 2022-08-02 DIAGNOSIS — Z01818 Encounter for other preprocedural examination: Secondary | ICD-10-CM

## 2022-08-02 DIAGNOSIS — L819 Disorder of pigmentation, unspecified: Secondary | ICD-10-CM

## 2022-08-04 ENCOUNTER — Encounter (INDEPENDENT_AMBULATORY_CARE_PROVIDER_SITE_OTHER): Payer: Self-pay | Admitting: Surgery

## 2022-08-04 NOTE — Progress Notes (Signed)
GENERAL SURGERY, [facility]  [address]  [place] [address]    History and Physical    Name: Tonya Francis MRN:  [id number]   Date: 08/02/2022 DOB:  05/26/1955 (66 y.o.)              Reason for Visit: Skin  Lesion (Chest wall, left leg, Right shoulder)    History of Present Illness  Tonya Francis presents today for evaluation and management of atypical lesions along the mid chest and back.    Diabetes, blood thinner      Review of the result(s) of each unique test:  Patient underwent diagnostic testing ( none ) prior to this dates visit.  I have personally reviewed the results and that serves as a component of the medical decision making for this encounter       Review of prior external note(s) from each unique source:  Patients referral to this office including a recent assessment by the referring provider.  This was reviewed by me for this unique office visit for the indication and intent of the referral as well as any pertinent medical or surgical history relevant to the patients independent evaluation by me today.      Patient History  Past Medical History:   Diagnosis Date    Arrhythmia     Cancer (CMS HCC)     basil cell skin    Cardiac disease 07/26/2021    Disorder of thyroid     Dyslipidemia     Esophageal reflux     Fluttering sensation of heart     Hiatal hernia     Hx of renal calculi     Hypertension     Palpitations     Thyroid condition     UTI (urinary tract infection) 07/26/2021         Past Surgical History:   Procedure Laterality Date    ABDOMINAL SURGERY      BOWEL BLOCKAGE    COLONOSCOPY      DENTAL SURGERY      HEMORROIDECTOMY      HX CATARACT REMOVAL      HX CHOLECYSTECTOMY      HX CYSTOSCOPY      BIOPSY OF UTERUS    HX EYE SURGERY      HX THYROID BIOPSY      HX TONSILLECTOMY      HX TUBAL LIGATION      KNEE ARTHROSCOPY Right     KNEE SURGERY      laproscopic    ORTHOPEDIC SURGERY Right     BROKEN RIGHT ARM         Current Outpatient Medications    Medication Sig    atenoloL (TENORMIN) 25 mg Oral Tablet Take 1 Tablet (25 mg total) by mouth Once a day    calcium carbonate (TUMS ORAL) Take 1 Tablet by mouth Once a day    cholecalciferol, vitamin D3, 1,250 mcg (50,000 unit) Oral Capsule 1 Capsule (50,000 Units total) 2 times weekly    cyanocobalamin (VITAMIN B12) 1,000 mcg/mL Injection Solution 1 mL (1,000 mcg total) Every 2 wks    doxycycline hyclate (VIBRAMYCIN) 100 mg Oral Capsule     ergocalciferol, vitamin D2, (DRISDOL) 1,250 mcg (50,000 unit) Oral Capsule Take 1 Capsule (50,000 Units total) by mouth Every 7 days (Patient not taking: Reported on 12/22/2021)    fluticasone propionate (FLONASE) 50 mcg/actuation Nasal Spray, Suspension Administer 1 Spray into each nostril Twice per day  as needed    Ibuprofen (MOTRIN) 800 mg Oral Tablet Take 1 Tablet (800 mg total) by mouth Three times a day    lactulose (ENULOSE) 10 gram/15 mL Oral Solution Take 15 mL by mouth Once a day (Patient not taking: Reported on 12/22/2021)    levothyroxine (SYNTHROID) 88 mcg Oral Tablet Take 1 Tablet (88 mcg total) by mouth Once a day    loratadine (CLARITIN) 10 mg Oral Tablet Take 1 Tablet (10 mg total) by mouth Once a day    montelukast (SINGULAIR) 10 mg Oral Tablet Take 1 Tablet (10 mg total) by mouth Every evening    multivitamin Oral Tablet Take 1 Tablet by mouth Once a day (Patient not taking: Reported on 12/22/2021)    mupirocin (BACTROBAN) 2 % Ointment     prenatal vitamin-iron-folate Tablet Take 1 Tablet by mouth Once a day     Allergies   Allergen Reactions    Latex Itching    Sulfa (Sulfonamides) Swelling and Angioedema     Family Medical History:       Problem Relation (Age of Onset)    Breast Cancer Sister    COPD Other    Congestive Heart Failure Other    Coronary Artery Disease Father    Elevated Lipids Other    Heart Attack Other    Heart Disease Brother    Hypertension (High Blood Pressure) Father    Kidney Stones Brother    No Known Problems Maternal Aunt, Maternal  Uncle, Paternal Aunt, Paternal Uncle, Maternal Grandmother, Maternal Grandfather, Paternal Grandmother, Paternal Grandfather, Daughter, Son    Primary Brain tumor Sister    Stroke Mother            Social History     Tobacco Use    Smoking status: Never    Smokeless tobacco: Never   Substance Use Topics    Alcohol use: Never    Drug use: Never            Physical Examination:  Vitals:    08/02/22 1539   BP: 139/61   Pulse: 67   Temp: 36 C (96.8 F)   SpO2: 100%   Weight: 73.9 kg (163 lb)   Height: 1.676 m (5\' 6" )   BMI: 26.36        General: appropriate for age. in no acute distress.    Vital signs are present above and have been reviewed by me     HEENT: Atraumatic, Normocephalic. PERRLA. EOMI. Nose clear. Throat clear    Lungs: Nonlabored breathing with symmetric expansion. Clear to auscultation bilaterally    Heart:Regular wth respect to rate and rythmn.    Abdomen:Soft. Nontender. Nondistended and benign    Atypical chest lesion and atypical mid back lesion    Extremities: Grossly normal. No major deformities     Neuro:  Grossly normal motor and sensory function    Psychiatric: Alert and oriented to person, place, and time. affect appropriate      Assessment and Plan  Excisional biopsy of atypical lesions of the mid chest and mid back scheduled for 08/22/2022    Risks, benefits, indications, complications of elective excision of these lesion(s) were discussed in detail including but not limited to bleeding, infection, reaction to anesthesia, need for further surgery and non-cosmetic result of the scar.   All questions were answered to the patient`s satisfaction with informed consent clearly obtained.      Follow Up:  No follow-ups on file.  ICD-10-CM    1. Atypical pigmented lesion  L81.9           Sourish Allender B Florrie Ramires, MD ,MBA,FACS    I appreciate the opportunity to be involved in the care of your patients.  If you have any questions or concerns regarding this encounter, please do not hesitate to contact me at  your convenience.      This note may have been partially generated using MModal Fluency Direct system, and there may be some incorrect words, spellings, and punctuation that were not noted in checking the note before saving, though effort was made to avoid such errors.

## 2022-08-17 ENCOUNTER — Other Ambulatory Visit: Payer: Self-pay

## 2022-08-17 ENCOUNTER — Inpatient Hospital Stay (HOSPITAL_BASED_OUTPATIENT_CLINIC_OR_DEPARTMENT_OTHER)
Admission: RE | Admit: 2022-08-17 | Discharge: 2022-08-17 | Disposition: A | Discharge status: Home / Self Care | Payer: Medicare Other | Source: Ambulatory Visit

## 2022-08-17 ENCOUNTER — Other Ambulatory Visit: Payer: Medicare Other

## 2022-08-17 DIAGNOSIS — Z01818 Encounter for other preprocedural examination: Secondary | ICD-10-CM | POA: Insufficient documentation

## 2022-08-17 LAB — BASIC METABOLIC PANEL
ANION GAP: 6 mmol/L (ref 4–13)
BUN/CREA RATIO: 16 (ref 6–22)
BUN: 14 mg/dL (ref 7–25)
CALCIUM: 9.2 mg/dL (ref 8.6–10.3)
CHLORIDE: 107 mmol/L (ref 98–107)
CO2 TOTAL: 28 mmol/L (ref 21–31)
CREATININE: 0.87 mg/dL (ref 0.60–1.30)
ESTIMATED GFR: 73 mL/min/{1.73_m2} (ref >59)
GLUCOSE: 92 mg/dL (ref 74–109)
OSMOLALITY, CALCULATED: 281 mOsm/kg (ref 270–290)
POTASSIUM: 3.7 mmol/L (ref 3.5–5.1)
SODIUM: 141 mmol/L (ref 136–145)

## 2022-08-17 LAB — CBC WITH DIFF
BASOPHIL #: 0 10*3/uL (ref 0.00–0.10)
BASOPHIL %: 1 % (ref 0–1)
EOSINOPHIL #: 0.1 10*3/uL (ref 0.00–0.50)
EOSINOPHIL %: 2 %
HCT: 38.5 % (ref 31.2–41.9)
HGB: 13.3 g/dL (ref 10.9–14.3)
LYMPHOCYTE #: 2.2 10*3/uL (ref 1.00–3.00)
LYMPHOCYTE %: 31 % (ref 16–44)
MCH: 31.6 pg (ref 24.7–32.8)
MCHC: 34.5 g/dL (ref 32.3–35.6)
MCV: 91.6 fL (ref 75.5–95.3)
MONOCYTE #: 0.7 10*3/uL (ref 0.30–1.00)
MONOCYTE %: 9 % (ref 5–13)
MPV: 8.4 fL (ref 7.9–10.8)
NEUTROPHIL #: 4.2 10*3/uL (ref 1.85–7.80)
NEUTROPHIL %: 58 % (ref 43–77)
PLATELETS: 232 10*3/uL (ref 140–440)
RBC: 4.2 10*6/uL (ref 3.63–4.92)
RDW: 13.6 % (ref 12.3–17.7)
WBC: 7.3 10*3/uL (ref 3.8–11.8)

## 2022-08-18 DIAGNOSIS — Z01810 Encounter for preprocedural cardiovascular examination: Secondary | ICD-10-CM

## 2022-08-18 LAB — ECG 12 LEAD
Atrial Rate: 69 {beats}/min
Calculated P Axis: 72 degrees
Calculated R Axis: 32 degrees
Calculated T Axis: 61 degrees
PR Interval: 146 ms
QRS Duration: 80 ms
QT Interval: 422 ms
QTC Calculation: 452 ms
Ventricular rate: 69 {beats}/min

## 2022-08-22 ENCOUNTER — Encounter (HOSPITAL_COMMUNITY)
Admission: RE | Disposition: A | Discharge status: Home / Self Care | Payer: Self-pay | Source: Ambulatory Visit | Attending: Surgery

## 2022-08-22 ENCOUNTER — Encounter (HOSPITAL_COMMUNITY): Payer: Self-pay | Admitting: Surgery

## 2022-08-22 ENCOUNTER — Inpatient Hospital Stay
Admission: RE | Admit: 2022-08-22 | Discharge: 2022-08-22 | Disposition: A | Discharge status: Home / Self Care | Payer: Medicare Other | Source: Ambulatory Visit | Attending: Surgery | Admitting: Surgery

## 2022-08-22 ENCOUNTER — Ambulatory Visit (HOSPITAL_COMMUNITY): Payer: Medicare Other | Admitting: Anesthesiology

## 2022-08-22 ENCOUNTER — Other Ambulatory Visit: Payer: Self-pay

## 2022-08-22 ENCOUNTER — Encounter (HOSPITAL_COMMUNITY): Payer: Medicare Other | Admitting: Surgery

## 2022-08-22 DIAGNOSIS — I10 Essential (primary) hypertension: Secondary | ICD-10-CM | POA: Insufficient documentation

## 2022-08-22 DIAGNOSIS — E039 Hypothyroidism, unspecified: Secondary | ICD-10-CM | POA: Insufficient documentation

## 2022-08-22 DIAGNOSIS — E785 Hyperlipidemia, unspecified: Secondary | ICD-10-CM | POA: Insufficient documentation

## 2022-08-22 DIAGNOSIS — J45909 Unspecified asthma, uncomplicated: Secondary | ICD-10-CM | POA: Insufficient documentation

## 2022-08-22 DIAGNOSIS — K449 Diaphragmatic hernia without obstruction or gangrene: Secondary | ICD-10-CM | POA: Insufficient documentation

## 2022-08-22 DIAGNOSIS — K219 Gastro-esophageal reflux disease without esophagitis: Secondary | ICD-10-CM | POA: Insufficient documentation

## 2022-08-22 DIAGNOSIS — L821 Other seborrheic keratosis: Secondary | ICD-10-CM | POA: Insufficient documentation

## 2022-08-22 SURGERY — EXCISION CYST
Anesthesia: Monitor Anesthesia Care | Site: Back | Wound class: Clean Wound: Uninfected operative wounds in which no inflammation occurred

## 2022-08-22 MED ORDER — SODIUM CHLORIDE 0.9 % (FLUSH) INJECTION SYRINGE
3.0000 mL | INJECTION | Freq: Three times a day (TID) | INTRAMUSCULAR | Status: DC
Start: 2022-08-22 — End: 2022-08-22

## 2022-08-22 MED ORDER — FENTANYL (PF) 50 MCG/ML INJECTION WRAPPER
50.0000 ug | INJECTION | INTRAMUSCULAR | Status: DC | PRN
Start: 2022-08-22 — End: 2022-08-22

## 2022-08-22 MED ORDER — FAMOTIDINE (PF) 20 MG/2 ML INTRAVENOUS SOLUTION
INTRAVENOUS | Status: AC
Start: 2022-08-22 — End: 2022-08-22
  Filled 2022-08-22: qty 2

## 2022-08-22 MED ORDER — FENTANYL (PF) 50 MCG/ML INJECTION WRAPPER
25.0000 ug | INJECTION | INTRAMUSCULAR | Status: DC | PRN
Start: 2022-08-22 — End: 2022-08-22

## 2022-08-22 MED ORDER — ACETAMINOPHEN 325 MG TABLET
650.0000 mg | ORAL_TABLET | ORAL | Status: DC | PRN
Start: 2022-08-22 — End: 2022-08-22

## 2022-08-22 MED ORDER — CEFAZOLIN 1 GRAM SOLUTION FOR INJECTION
Freq: Once | INTRAMUSCULAR | Status: DC | PRN
Start: 2022-08-22 — End: 2022-08-22
  Administered 2022-08-22: 2000 mg via INTRAVENOUS

## 2022-08-22 MED ORDER — LACTATED RINGERS INTRAVENOUS SOLUTION
INTRAVENOUS | Status: DC
Start: 2022-08-22 — End: 2022-08-22

## 2022-08-22 MED ORDER — ALBUTEROL SULFATE 2.5 MG/3 ML (0.083 %) SOLUTION FOR NEBULIZATION
2.5000 mg | INHALATION_SOLUTION | Freq: Once | RESPIRATORY_TRACT | Status: DC | PRN
Start: 2022-08-22 — End: 2022-08-22

## 2022-08-22 MED ORDER — HYDROCODONE 5 MG-ACETAMINOPHEN 325 MG TABLET
1.0000 | ORAL_TABLET | ORAL | Status: DC | PRN
Start: 2022-08-22 — End: 2022-08-22
  Administered 2022-08-22: 1 via ORAL
  Filled 2022-08-22: qty 1

## 2022-08-22 MED ORDER — HYDROMORPHONE 2 MG/ML INJECTION WRAPPER
0.2000 mg | INJECTION | INTRAMUSCULAR | Status: DC | PRN
Start: 2022-08-22 — End: 2022-08-22

## 2022-08-22 MED ORDER — ONDANSETRON HCL (PF) 4 MG/2 ML INJECTION SOLUTION
4.0000 mg | Freq: Four times a day (QID) | INTRAMUSCULAR | Status: DC | PRN
Start: 2022-08-22 — End: 2022-08-22

## 2022-08-22 MED ORDER — IPRATROPIUM 0.5 MG-ALBUTEROL 3 MG (2.5 MG BASE)/3 ML NEBULIZATION SOLN
3.0000 mL | INHALATION_SOLUTION | Freq: Once | RESPIRATORY_TRACT | Status: DC | PRN
Start: 2022-08-22 — End: 2022-08-22

## 2022-08-22 MED ORDER — MIDAZOLAM 5 MG/ML INJECTION WRAPPER
2.0000 mg | Freq: Once | INTRAMUSCULAR | Status: DC | PRN
Start: 2022-08-22 — End: 2022-08-22
  Administered 2022-08-22: 2 mg via INTRAVENOUS

## 2022-08-22 MED ORDER — ONDANSETRON HCL (PF) 4 MG/2 ML INJECTION SOLUTION
4.0000 mg | Freq: Once | INTRAMUSCULAR | Status: DC | PRN
Start: 2022-08-22 — End: 2022-08-22

## 2022-08-22 MED ORDER — DEXAMETHASONE SODIUM PHOSPHATE 4 MG/ML INJECTION SOLUTION
INTRAMUSCULAR | Status: AC
Start: 2022-08-22 — End: 2022-08-22
  Filled 2022-08-22: qty 1

## 2022-08-22 MED ORDER — EPHEDRINE SULFATE 50 MG/ML INTRAVENOUS SOLUTION
Freq: Once | INTRAVENOUS | Status: DC | PRN
Start: 2022-08-22 — End: 2022-08-22
  Administered 2022-08-22 (×2): 10 mg via INTRAVENOUS

## 2022-08-22 MED ORDER — KETOROLAC 30 MG/ML (1 ML) INJECTION SOLUTION
15.0000 mg | Freq: Four times a day (QID) | INTRAMUSCULAR | Status: DC | PRN
Start: 2022-08-22 — End: 2022-08-22

## 2022-08-22 MED ORDER — FAMOTIDINE (PF) 20 MG/2 ML INTRAVENOUS SOLUTION
20.0000 mg | Freq: Once | INTRAVENOUS | Status: AC
Start: 2022-08-22 — End: 2022-08-22
  Administered 2022-08-22: 20 mg via INTRAVENOUS

## 2022-08-22 MED ORDER — ONDANSETRON HCL (PF) 4 MG/2 ML INJECTION SOLUTION
4.0000 mg | Freq: Once | INTRAMUSCULAR | Status: AC
Start: 2022-08-22 — End: 2022-08-22
  Administered 2022-08-22: 4 mg via INTRAVENOUS

## 2022-08-22 MED ORDER — SODIUM CHLORIDE 0.9 % (FLUSH) INJECTION SYRINGE
3.0000 mL | INJECTION | INTRAMUSCULAR | Status: DC | PRN
Start: 2022-08-22 — End: 2022-08-22

## 2022-08-22 MED ORDER — FENTANYL (PF) 50 MCG/ML INJECTION WRAPPER
INJECTION | Freq: Once | INTRAMUSCULAR | Status: DC | PRN
Start: 2022-08-22 — End: 2022-08-22
  Administered 2022-08-22 (×2): 50 ug via INTRAVENOUS

## 2022-08-22 MED ORDER — MIDAZOLAM 5 MG/ML INJECTION WRAPPER
INTRAMUSCULAR | Status: AC
Start: 2022-08-22 — End: 2022-08-22
  Filled 2022-08-22: qty 1

## 2022-08-22 MED ORDER — DEXAMETHASONE SODIUM PHOSPHATE 4 MG/ML INJECTION SOLUTION
4.0000 mg | Freq: Once | INTRAMUSCULAR | Status: AC
Start: 2022-08-22 — End: 2022-08-22
  Administered 2022-08-22: 4 mg via INTRAVENOUS

## 2022-08-22 MED ORDER — CEFAZOLIN 1 GRAM SOLUTION FOR INJECTION
INTRAMUSCULAR | Status: AC
Start: 2022-08-22 — End: 2022-08-22
  Filled 2022-08-22: qty 20

## 2022-08-22 MED ORDER — ROPIVACAINE (PF) 2 MG/ML (0.2 %) INJECTION SOLUTION
Freq: Once | INTRAMUSCULAR | Status: DC | PRN
Start: 2022-08-22 — End: 2022-08-22
  Administered 2022-08-22: 10 mL via INTRAMUSCULAR

## 2022-08-22 MED ORDER — LIDOCAINE (PF) 100 MG/5 ML (2 %) INTRAVENOUS SYRINGE
INJECTION | Freq: Once | INTRAVENOUS | Status: DC | PRN
Start: 2022-08-22 — End: 2022-08-22
  Administered 2022-08-22: 100 mg via INTRAVENOUS

## 2022-08-22 MED ORDER — METOCLOPRAMIDE 5 MG/ML INJECTION SOLUTION
10.0000 mg | Freq: Four times a day (QID) | INTRAMUSCULAR | Status: DC | PRN
Start: 2022-08-22 — End: 2022-08-22

## 2022-08-22 MED ORDER — PROCHLORPERAZINE EDISYLATE 10 MG/2 ML (5 MG/ML) INJECTION SOLUTION
5.0000 mg | Freq: Once | INTRAMUSCULAR | Status: DC | PRN
Start: 2022-08-22 — End: 2022-08-22

## 2022-08-22 MED ORDER — TRAMADOL 50 MG TABLET
1.0000 | ORAL_TABLET | ORAL | 1 refills | Status: DC | PRN
Start: 2022-08-22 — End: 2023-05-03

## 2022-08-22 MED ORDER — ROPIVACAINE (PF) 2 MG/ML (0.2 %) INJECTION SOLUTION
INTRAMUSCULAR | Status: AC
Start: 2022-08-22 — End: 2022-08-22
  Filled 2022-08-22: qty 20

## 2022-08-22 MED ORDER — FENTANYL (PF) 50 MCG/ML INJECTION SOLUTION
INTRAMUSCULAR | Status: AC
Start: 2022-08-22 — End: 2022-08-22
  Filled 2022-08-22: qty 2

## 2022-08-22 MED ORDER — ONDANSETRON HCL (PF) 4 MG/2 ML INJECTION SOLUTION
INTRAMUSCULAR | Status: AC
Start: 2022-08-22 — End: 2022-08-22
  Filled 2022-08-22: qty 2

## 2022-08-22 MED ORDER — PROPOFOL 10 MG/ML IV BOLUS
INJECTION | Freq: Once | INTRAVENOUS | Status: DC | PRN
Start: 2022-08-22 — End: 2022-08-22
  Administered 2022-08-22: 20 mg via INTRAVENOUS
  Administered 2022-08-22: 30 mg via INTRAVENOUS

## 2022-08-22 SURGICAL SUPPLY — 48 items
ADH LIQUID LF  WTPRF VIAL PREP NONSTAIN MASTISOL STYRAX GUM MASTIC ALC MTHY SLCYT STRL CLR NHZR 2/3 (WOUND CARE SUPPLY) ×1 IMPLANT
BAG SUT DVN STRL LF (SUTURE/WOUND CLOSURE) IMPLANT
BLADE 10 2 END CBNSTL SURG STRL DISP (SURGICAL CUTTING SUPPLIES) ×1 IMPLANT
BLADE 15 2 END CBNSTL SURG STRL DISP (SURGICAL CUTTING SUPPLIES) ×1 IMPLANT
BLADE SURG CLPR W 37.2MM GP EXIST HNDL GTT IN CHRG .23MM NONST LF  DISP (MED SURG SUPPLIES) IMPLANT
CLEANER ESURG TIP 2X2IN TIP POLISHR CAUT STRL LF (SURGICAL CUTTING SUPPLIES) IMPLANT
COUNTER 20 CNT BLOCK ADH NEEDLE STRL LF  RD SHARP FOAM 15.75X11.5X14IN DISP (MED SURG SUPPLIES) ×1 IMPLANT
COVER 53X24IN MAYOSTAND PRXM STRL DISP EQP SMS LF (DRAPE/PACKS/SHEETS/OR TOWEL) ×1 IMPLANT
COVER TBL 90X50IN STD SMS REINF FNFLD STRL LF  DISP (DRAPE/PACKS/SHEETS/OR TOWEL) ×2 IMPLANT
DETERGENT INSTR 22OZ TRNSPT GEL RINSE FREE NEUT PH PREKLENZ CLR PLSNT LF (MISCELLANEOUS PT CARE ITEMS) ×1 IMPLANT
DRAIN INCS .25IN 18IN PNRS SAF PIN XRY OPQ STRL LTX STD DISP (MED SURG SUPPLIES) IMPLANT
DRAPE FNFLD ABS REINF 77X53IN 43528 PRXM LF  STRL DISP SURG SMS 44X23IN (DRAPE/PACKS/SHEETS/OR TOWEL) ×1 IMPLANT
ELECTRODE ESURG BLADE PNCL 15FT VLAB EDGE TELESCP SMOKE EVAC (SURGICAL CUTTING SUPPLIES) ×1 IMPLANT
ELECTRODE ESURG NEEDLE 2.8IN 3/32IN VLAB STRL SS 1.1IN DISP STD SHAFT LF (SURGICAL CUTTING SUPPLIES) IMPLANT
GLOVE SURG 6 LF  PF BEAD CUF STRL CRM 11.3IN PROTEXIS PLISPRN THK9.1 MIL (GLOVES AND ACCESSORIES) IMPLANT
GLOVE SURG 6 LF  PF SMOOTH BEAD CUF INTLK STRL BLU 11.3IN PROTEXIS NEU-THERA PLISPRN THK7.9 MIL (GLOVES AND ACCESSORIES) IMPLANT
GLOVE SURG 6.5 LF  PF BEAD CUF STRL CRM 11.3IN PROTEXIS PI PLISPRN THK9.1 MIL (GLOVES AND ACCESSORIES) ×1 IMPLANT
GLOVE SURG 6.5 LF  PF SMOOTH BEAD CUF INTLK STRL BLU 11.3IN PROTEXIS NEU-THERA PLISPRN THK7.9 MIL (GLOVES AND ACCESSORIES) IMPLANT
GLOVE SURG 6.5 LTX PF SMOOTH BEAD CUF STRL YW 11.5IN PROTEXIS NEU-THERA DDRGL THK8.7 MIL (GLOVES AND ACCESSORIES) IMPLANT
GLOVE SURG 7 LF  PF BEAD CUF STRL CRM 11.8IN PROTEXIS PI PLISPRN THK9.1 MIL (GLOVES AND ACCESSORIES) ×1 IMPLANT
GLOVE SURG 7 LF  PF SMOOTH BEAD CUF INTLK STRL BLU 11.8IN PROTEXIS NEU-THERA PLISPRN THK7.9 MIL (GLOVES AND ACCESSORIES) ×1 IMPLANT
GLOVE SURG 7 LTX PF SMOOTH BEAD CUF STRL YW 12IN PROTEXIS NEU-THERA DDRGL THK8.7 MIL (GLOVES AND ACCESSORIES) IMPLANT
GLOVE SURG 7.5 LF  PF BEAD CUF STRL CRM 11.8IN PROTEXIS PI PLISPRN THK9.1 MIL (GLOVES AND ACCESSORIES) IMPLANT
GLOVE SURG 7.5 LF  PF SMOOTH BEAD CUF INTLK STRL BLU 11.8IN PROTEXIS NEU-THERA PLISPRN THK7.9 MIL (GLOVES AND ACCESSORIES) IMPLANT
GLOVE SURG 7.5 LTX PF SMOOTH BEAD CUF STRL YW 12IN PROTEXIS (GLOVES AND ACCESSORIES) IMPLANT
GLOVE SURG 8 LF  PF BEAD CUF STRL CRM 11.8IN PROTEXIS PI PLISPRN THK9.1 MIL (GLOVES AND ACCESSORIES) IMPLANT
GLOVE SURG 8 LF  PF SMOOTH BEAD CUF INTLK STRL BLU 11.8IN PROTEXIS NEU-THERA PLISPRN THK7.9 MIL (GLOVES AND ACCESSORIES) IMPLANT
GLOVE SURG 8.5 LF  PF BEAD CUF STRL CRM 11.8IN PROTEXIS PI PLISPRN THK9.1 MIL (GLOVES AND ACCESSORIES) IMPLANT
GOWN SURG LRG STD LGTH REG L3 NONREINFORCE BRTHBL TWL STRL LF  DISP BLU HALYARD SPECTRUM SMS (DRAPE/PACKS/SHEETS/OR TOWEL) ×2 IMPLANT
GOWN SURG XL STD LGTH L3 NONREINFORCE HKLP CLSR TWL STRL LF  DISP BLU SPECTRUM SMS (DRAPE/PACKS/SHEETS/OR TOWEL) ×2
GOWN SURG XL STD LGTH L3 NONREINFORCE HKLP CLSR TWL STRL LF DISP BLU SPECTRUM SMS (DRAPE/PACKS/SHEETS/OR TOWEL) ×2 IMPLANT
NEEDLE HYPO  18GA 1.5IN REG WL BD PRCSNGL POLYPROP REG BVL LL HUB CLR CD DEHP-FR STRL LF  DISP (MED SURG SUPPLIES) IMPLANT
NEEDLE HYPO  23GA 1.5IN TW PRCSNGL SS POLYPROP REG BVL LL HUB DEHP-FR TRQS STRL LF  DISP (MED SURG SUPPLIES) ×2 IMPLANT
PACK SURG UBR BCK TBL CVR ZN REINF MAYO STAND CVR STRL DISP 90X44IN 54X23IN LF (CUSTOM TRAYS & PACK) ×1 IMPLANT
PEN SURG MRKNG DISP RLR LBL STRL LF  6IN (MED SURG SUPPLIES) ×1 IMPLANT
SLEEVE COMPRESS LRG KNEE LGTH KENDALL SCD NONST LF  DISP 26- IN (MED SURG SUPPLIES) IMPLANT
SLEEVE COMPRESS MED KNEE LGTH KENDALL SCD SEQ NONST LF  DISP 21- IN DVT PE (MED SURG SUPPLIES) IMPLANT
SOL IRRG 0.9% NACL 1000ML PLASTIC PR BTL ISTNC N-PYRG STRL LF (MEDICATIONS/SOLUTIONS) ×1 IMPLANT
SPONGE GAUZE 2X2IN MDCHC COTTON 8P LF  STRL DISP WHT (WOUND CARE SUPPLY) ×1 IMPLANT
SPONGE GAUZE 4X4IN AV GZ CLU COTTON ABS NWVN POSTOP LF  STRL DISP (WOUND CARE SUPPLY) ×1 IMPLANT
SPONGE GAUZE 4X4IN MDCHC COTTON 12 PLY TY 7 LF  STRL DISP (WOUND CARE SUPPLY) IMPLANT
STRIP 4X.25IN STRSTRP MDCHC REINF BRTHBL BCK HYPOALL ADH SKNCLS STRL LF (WOUND CARE SUPPLY) IMPLANT
STRIP 4X.5IN STRSTRP PLSTR REINF SKNCLS WHT STRL LF (WOUND CARE SUPPLY) ×1 IMPLANT
SUTURE 2-0 CT2 VICRYL 27IN VIOL BRD COAT ABS (SUTURE/WOUND CLOSURE) ×1 IMPLANT
SUTURE 5-0 FS2 VICRYL 27IN UNDYED BRD COAT ABS (SUTURE/WOUND CLOSURE) ×1 IMPLANT
SYRINGE LL 10ML LF  STRL GRAD N-PYRG DEHP-FR PVC FREE MED DISP (MED SURG SUPPLIES) ×2 IMPLANT
TOWEL 24X16IN COTTON BLU DISP SURG STRL LF (DRAPE/PACKS/SHEETS/OR TOWEL) ×2 IMPLANT
TUBING SUCT CLR 6FT .25IN ARGYLE PVC NCDTV STR MALE FEMALE MLD CONN STRL LF (MED SURG SUPPLIES) ×1 IMPLANT

## 2022-08-22 NOTE — H&P (Signed)
[facility]  General Surgery  History and Physical    Date of Service:  08/22/2022  Tonya, Francis, 67 y.o. female  Date of Admission:  08/22/2022  Date of Birth:  1956-01-07  PCP: Manuella Ghazi, DO    Reason for admission:  Excisional biopsy of chest wall lesion back lesion    HPI:  Tonya Francis is a 67 y.o. White female who is admitted for CHESTWALL LESION; MIDBACK LESION     Ms. Lemarr presents today for evaluation and management of atypical lesions along the mid chest and back.     Diabetes, blood thinner        Review of the result(s) of each unique test:  Patient underwent diagnostic testing ( none ) prior to this dates visit.  I have personally reviewed the results and that serves as a component of the medical decision making for this encounter        Review of prior external note(s) from each unique source:  Patients referral to this office including a recent assessment by the referring provider.  This was reviewed by me for this unique office visit for the indication and intent of the referral as well as any pertinent medical or surgical history relevant to the patients independent evaluation by me today.    Past Medical History:   Diagnosis Date    Arrhythmia     Cancer (CMS HCC)     basil cell skin    Cardiac disease 07/26/2021    Disorder of thyroid     Dyslipidemia     Esophageal reflux     Fluttering sensation of heart     Hiatal hernia     Hx of renal calculi     Hypertension     Palpitations     Thyroid condition     UTI (urinary tract infection) 07/26/2021      Past Surgical History:   Procedure Laterality Date    ABDOMINAL SURGERY      BOWEL BLOCKAGE    COLONOSCOPY      DENTAL SURGERY      HEMORROIDECTOMY      HX CATARACT REMOVAL      HX CHOLECYSTECTOMY      HX CYSTOSCOPY      BIOPSY OF UTERUS    HX EYE SURGERY      HX THYROID BIOPSY      HX TONSILLECTOMY      HX TUBAL LIGATION      KNEE ARTHROSCOPY Right     KNEE SURGERY      laproscopic    ORTHOPEDIC SURGERY  Right     BROKEN RIGHT ARM      Social History     Tobacco Use    Smoking status: Never    Smokeless tobacco: Never   Vaping Use    Vaping status: Never Used   Substance Use Topics    Alcohol use: Never    Drug use: Never       Family Medical History:       Problem Relation (Age of Onset)    Breast Cancer Sister    COPD Other    Congestive Heart Failure Other    Coronary Artery Disease Father    Elevated Lipids Other    Heart Attack Other    Heart Disease Brother    Hypertension (High Blood Pressure) Father    Kidney Stones Brother    No Known Problems Maternal Aunt, Maternal Uncle, Paternal Aunt, Paternal  Uncle, Maternal Grandmother, Maternal Grandfather, Paternal Grandmother, Paternal Grandfather, Daughter, Son    Primary Brain tumor Sister    Stroke Mother           Medications Prior to Admission       Prescriptions    atenoloL (TENORMIN) 25 mg Oral Tablet    Take 1 Tablet (25 mg total) by mouth Once a day    BIOTIN ORAL    Take 6,000 mcg by mouth Once a day    cholecalciferol, vitamin D3, 25 mcg (1,000 unit) Oral Tablet    Take 2 Tablets (2,000 Units total) by mouth Once a day    cyanocobalamin (VITAMIN B12) 1,000 mcg/mL Injection Solution    Inject 1 mL (1,000 mcg total) into the muscle Every 14 days Every 2 wks    doxycycline hyclate (VIBRAMYCIN) 100 mg Oral Capsule    Take 1 Capsule (100 mg total) by mouth Once a day    esomeprazole magnesium (NEXIUM) 40 mg Oral Capsule, Delayed Release(E.C.)    Take 1 Capsule (40 mg total) by mouth Once per day as needed    fluticasone propionate (FLONASE) 50 mcg/actuation Nasal Spray, Suspension    Administer 1 Spray into each nostril Twice per day as needed    Ibuprofen (MOTRIN) 800 mg Oral Tablet    Take 1 Tablet (800 mg total) by mouth Twice per day as needed    levothyroxine (SYNTHROID) 75 mcg Oral Tablet    Take 1 Tablet (75 mcg total) by mouth Once a day    montelukast (SINGULAIR) 10 mg Oral Tablet    Take 1 Tablet (10 mg total) by mouth Every evening    prenatal  vitamin-iron-folate Tablet    Take 1 Tablet by mouth Once a day    UNKNOWN MEDICATION (UNKNOWN MEDICATION)    Take 10 mg by mouth Once a day           Allergies   Allergen Reactions    Latex Itching    Sulfa (Sulfonamides) Swelling and Angioedema          Patient Vitals for the past 24 hrs:   BP Temp Pulse Resp SpO2 Height Weight   08/22/22 0715 (!) 143/88 36.6 C (97.8 F) 62 18 97 % 1.676 m (5\' 6" ) 73 kg (161 lb)          General: appropriate for age. in no acute distress.    Vital signs are present above and have been reviewed by me     HEENT: Atraumatic, Normocephalic. PERRLA, EOMI. Nose clear. Throat clear.    Lungs: Nonlabored breathing with symmetric expansion.  Clear to auscultation bilaterally    Heart:Regular wth respect to rate and rythmn.    Mid chest lesion and mid back lesion both atypical in appearance    Abdomen:Soft. Nontender. Nondistended and benign.    Extremities:  Grossly normal with good range of motion and no major deformities.    Neuro:  Grossly normal motor and sensory function. CN's II through XII intact.    Psychiatric: Alert and oriented to person, place, and time. affect appropriate    Laboratory Data:     No results found for any visits on 08/22/22 (from the past 24 hour(s)).    Imaging Studies:    No orders to display        Assessment/Plan:  CHESTWALL LESION; MIDBACK LESION    Excisional biopsy of mid chest wall lesion and mid back lesions scheduled for Monday August 22, 2022  Risks, benefits, indications, complications of elective excision of these lesion(s) were discussed in detail including but not limited to bleeding, infection, reaction to anesthesia, need for further surgery and non-cosmetic result of the scar.   All questions were answered to the patient`s satisfaction with informed consent clearly obtained.    This note was partially created using voice recognition software and is inherently subject to errors including those of syntax and "sound alike " substitutions which  may escape proof reading. In such instances, original meaning may be extrapolated by contextual derivation.    Fidela Juneau, MD, MBA, FACS

## 2022-08-22 NOTE — Anesthesia Transfer of Care (Signed)
ANESTHESIA TRANSFER OF CARE   Tonya Francis is a 67 y.o. ,female, Weight: 73 kg (161 lb)   had Procedure(s):  EXCISIONAL BIOPSY OF MID BACK LESION  performed  08/22/22   Primary Service: Fidela Juneau, MD    Past Medical History:   Diagnosis Date   . Arrhythmia    . Cancer (CMS HCC)     basil cell skin   . Cardiac disease 07/26/2021   . Disorder of thyroid    . Dyslipidemia    . Esophageal reflux    . Fluttering sensation of heart    . Hiatal hernia    . Hx of renal calculi    . Hypertension    . Palpitations    . Thyroid condition    . UTI (urinary tract infection) 07/26/2021      Allergy History as of 08/22/22       SULFA (SULFONAMIDES)         Noted Status Severity Type Reaction    07/26/21 0559 Coralie Keens, [place] 04/01/18 Active High  Swelling, Angioedema              LATEX         Noted Status Severity Type Reaction    07/26/21 0559 Coralie Keens, [place] 02/01/21 Active Medium  Itching                  I completed my transfer of care / handoff to the receiving personnel during which we discussed:  Access, Airway, All key/critical aspects of case discussed, Analgesia, Antibiotics, Expectation of post procedure, Fluids/Product, Gave opportunity for questions and acknowledgement of understanding, Labs and PMHx  Report given to: Marlana Latus, RN    Post Location: PACU                                                           Last OR Temp: Temperature: 36.7 C (98.1 F)  ABG:  POTASSIUM   Date Value Ref Range Status   08/17/2022 3.7 3.5 - 5.1 mmol/L Final     CALCIUM   Date Value Ref Range Status   08/17/2022 9.2 8.6 - 10.3 mg/dL Final     Calculated P Axis   Date Value Ref Range Status   08/17/2022 72 degrees Final     Calculated R Axis   Date Value Ref Range Status   08/17/2022 32 degrees Final     Calculated T Axis   Date Value Ref Range Status   08/17/2022 61 degrees Final     Airway:* No LDAs found *  Blood pressure (!) 108/57, pulse 69, temperature 36.7 C (98.1 F), resp. rate 16, height 1.676 m (5\' 6" ),  weight 73 kg (161 lb), SpO2 100%.

## 2022-08-22 NOTE — Anesthesia Preprocedure Evaluation (Signed)
ANESTHESIA PRE-OP EVALUATION  Planned Procedure: EXCISIONAL BIOPSY OF MID CHEST AND MID BACK LESIONS (Chest)  Review of Systems     anesthesia history negative     patient summary reviewed  nursing notes reviewed        Pulmonary   asthma,   Cardiovascular    Hypertension, well controlled, dysrhythmias, ECG reviewed and hyperlipidemia ,No peripheral edema,  Exercise Tolerance: > or = 4 METS        GI/Hepatic/Renal    hiatal hernia, GERD, well controlled and kidney stones        Endo/Other    hypothyroidism,      Neuro/Psych/MS    headaches     Cancer  CA,                 Physical Assessment      Airway       Mallampati: III    TM distance: >3 FB    Neck ROM: full  Mouth Opening: good.            Dental       Dentition intact             Pulmonary    Breath sounds clear to auscultation  (-) no rhonchi, no decreased breath sounds, no wheezes, no rales and no stridor     Cardiovascular    Rhythm: regular  Rate: Normal  (-) no friction rub, carotid bruit is not present, no peripheral edema and no murmur     Other findings          Plan  ASA 3     Planned anesthesia type: MAC           PONV Plan:  I plan to administer pharmcologic prophalaxis antiemetics                  Anesthesia issues/risks discussed are: Dental Injuries, Stroke, Aspiration, Cardiac Events/MI and Intraoperative Awareness/ Recall.  Anesthetic plan and risks discussed with patient  signed consent obtained          Patient's NPO status is appropriate for Anesthesia.           Plan discussed with CRNA.

## 2022-08-22 NOTE — Anesthesia Postprocedure Evaluation (Signed)
Anesthesia Post Op Evaluation    Patient: Tonya Francis  Procedure(s):  EXCISIONAL BIOPSY OF MID BACK LESION    Last Vitals:Temperature: 36.7 C (98.1 F) (08/22/22 1245)  Heart Rate: 71 (08/22/22 1245)  BP (Non-Invasive): (!) 110/53 (08/22/22 1245)  Respiratory Rate: 16 (08/22/22 1245)  SpO2: 97 % (08/22/22 1245)    There were no known notable events for this encounter.    Patient is sufficiently recovered from the effects of anesthesia to participate in the evaluation and has returned to their pre-procedure level.  Patient location during evaluation: PACU       Patient participation: complete - patient participated  Level of consciousness: awake and alert and responsive to verbal stimuli    Pain management: adequate  Airway patency: patent    Anesthetic complications: no  Cardiovascular status: acceptable  Respiratory status: acceptable  Hydration status: acceptable  Patient post-procedure temperature: Pt Normothermic   PONV Status: Absent

## 2022-08-22 NOTE — OR Surgeon (Signed)
[facility]      Patient Name: Tonya Francis, Tonya Francis  Hospital Number: [id number]  Date of Service: 08/22/2022   Date of Birth: [birth year]      Pre-Operative Diagnosis: MIDBACK LESION     Post-Operative Diagnosis: MIDBACK LESION    Procedure(s)/Description:  EXCISIONAL BIOPSY OF MID BACK LESION: 21930 (CPT)     Attending Surgeon: Fidela Juneau, MD     Anesthesia:  CRNA: Margaretha Glassing, CRNA    Anesthesia Type: .Monitor Anesthesia Care     Estimated Blood Loss:  None    Specimens Removed:   ID Type Source Tests Collected by Time Destination   1 : BACK LESION Tissue Other (Specify Site in comments) SURGICAL PATHOLOGY SPECIMEN Marshell Dilauro, Cherylann Parr, MD 08/22/2022 1230       Order Name Source Comment Collection Info Order Time   SURGICAL PATHOLOGY SPECIMEN Other (Specify Site in comments) Pre-op diagnosis:  MIDBACK LESION Collected By: Fidela Juneau, MD 08/22/2022 12:30 PM     Release to patient   Manual release only          Additional details for preventing immediate release   PER SURGEON REQUEST             The patient was brought to the operating suite.   The mid back area was prepped and draped in usual sterile fashion followed by injection of local analgesia.   The area of concern was then excised with visibly clear margins with hemostasis ensured.  The preoperative size of the lesion was 1.5 centimeters.  The wound was closed with 5 0 Polysorb suture in a buried running subcuticular approach.. A sterile pressure dressing was applied.  The patient tolerated procedure well.  No complications were encountered.  The patient was taken to the recovery room in stable condition.    Denajah Farias B. Jevan Gaunt, MD, MBA, FACS  [facility] -General Surgery

## 2022-08-23 ENCOUNTER — Telehealth (INDEPENDENT_AMBULATORY_CARE_PROVIDER_SITE_OTHER): Payer: Self-pay | Admitting: Surgery

## 2022-08-23 DIAGNOSIS — L989 Disorder of the skin and subcutaneous tissue, unspecified: Secondary | ICD-10-CM

## 2022-08-23 LAB — SURGICAL PATHOLOGY SPECIMEN

## 2022-09-13 ENCOUNTER — Encounter (INDEPENDENT_AMBULATORY_CARE_PROVIDER_SITE_OTHER): Payer: Self-pay | Admitting: Surgery

## 2022-09-13 ENCOUNTER — Ambulatory Visit (INDEPENDENT_AMBULATORY_CARE_PROVIDER_SITE_OTHER): Payer: Medicare Other | Admitting: Surgery

## 2022-09-13 ENCOUNTER — Other Ambulatory Visit: Payer: Self-pay

## 2022-09-13 VITALS — BP 146/94 | HR 70 | Temp 97.0°F | Resp 18 | Ht 66.0 in | Wt 162.0 lb

## 2022-09-13 DIAGNOSIS — Z9889 Other specified postprocedural states: Secondary | ICD-10-CM

## 2022-09-13 NOTE — Progress Notes (Signed)
GENERAL SURGERY, [facility]  [address]  [place] [address]    Post-Op/Procedure Note    Name: Tonya Francis MRN:  [id number]   Date:   09/13/2022 DOB:  1955-12-31 (66 y.o.)               Subjective:  Tonya Francis presents 3 weeks following a excision of mid back irritated lesion.   She is eating a regular diet without difficulty.  No nausea, vomiting, or abdominal pain.  Bowel movements are Normal.    The patient is not having any pain..  No fevers.     Objective:  BP (!) 146/94   Pulse 70   Temp 36.1 C (97 F)   Resp 18   Ht 1.676 m (5\' 6" )   Wt 73.5 kg (162 lb)   SpO2 98%   BMI 26.15 kg/m       General:  alert, cooperative, no distress, appears stated age  Abdomen: soft  Incision:  healing well    Path report:  Benign keratosis most consistent with seborrheic keratosis    Assessment/Plan:  Doing well postoperatively.  Activity: Pt is to increase activities as tolerated.  Return if symptoms worsen or fail to improve.    Fidela Juneau, MD

## 2022-09-22 ENCOUNTER — Inpatient Hospital Stay
Admission: RE | Admit: 2022-09-22 | Discharge: 2022-09-22 | Disposition: A | Discharge status: Home / Self Care | Payer: Medicare Other | Source: Ambulatory Visit | Attending: OTOLARYNGOLOGY | Admitting: OTOLARYNGOLOGY

## 2022-09-22 ENCOUNTER — Other Ambulatory Visit: Payer: Self-pay

## 2022-09-22 DIAGNOSIS — E041 Nontoxic single thyroid nodule: Secondary | ICD-10-CM | POA: Insufficient documentation

## 2022-10-04 ENCOUNTER — Ambulatory Visit (INDEPENDENT_AMBULATORY_CARE_PROVIDER_SITE_OTHER): Payer: Medicare Other | Admitting: OTOLARYNGOLOGY

## 2022-10-04 ENCOUNTER — Other Ambulatory Visit: Payer: Self-pay

## 2022-10-04 ENCOUNTER — Encounter (INDEPENDENT_AMBULATORY_CARE_PROVIDER_SITE_OTHER): Payer: Self-pay | Admitting: OTOLARYNGOLOGY

## 2022-10-04 VITALS — Ht 66.0 in | Wt 162.0 lb

## 2022-10-04 DIAGNOSIS — H9312 Tinnitus, left ear: Secondary | ICD-10-CM

## 2022-10-04 DIAGNOSIS — E041 Nontoxic single thyroid nodule: Secondary | ICD-10-CM

## 2022-10-04 DIAGNOSIS — J309 Allergic rhinitis, unspecified: Secondary | ICD-10-CM

## 2022-10-04 DIAGNOSIS — J3489 Other specified disorders of nose and nasal sinuses: Secondary | ICD-10-CM

## 2022-10-04 DIAGNOSIS — H6121 Impacted cerumen, right ear: Secondary | ICD-10-CM

## 2022-10-04 DIAGNOSIS — J342 Deviated nasal septum: Secondary | ICD-10-CM

## 2022-10-04 MED ORDER — MUPIROCIN 2 % TOPICAL OINTMENT
TOPICAL_OINTMENT | Freq: Two times a day (BID) | CUTANEOUS | 1 refills | Status: DC
Start: 2022-10-04 — End: 2023-11-03

## 2022-10-04 NOTE — H&P (Signed)
[facility]  [address]  [place] [address]    Return Patient Visit    Name: Tonya Francis MRN:  [id number]   Date: 10/04/2022 DOB: Mar 25, 1956 (66 y.o.)       Referring Provider:  No ref. provider found    Reason for Visit:   Chief Complaint   Patient presents with    Follow-up After Testing     1 year rc on thyroid and after repeat thyroid US.        History of Present Illness:  Tonya Francis is a 67 y.o. female who is FU on thyroid.     She c/o "odd sound" in the left ear x 1 week. Yawning resolves the sound. Denies change in hearing, otalgia, otorrhea. She has frequent popping in her neck and arthritis.       Narrative & Impression   Demitria F Banke     RADIOLOGIST: Mickey Farber, MD     US THYROID performed on 09/22/2022 12:43 PM     CLINICAL HISTORY:   E04.1: Right thyroid nodule.  F/U RT THYROID NODULE     TECHNIQUE: Thyroid ultrasound     COMPARISON:  09/14/2021     FINDINGS:  Right thyroid lobe measures 3.6 cm x 1.5 cm x 2.0 cm.  Left thyroid lobe measures 2.8 cm x 0.9 cm x 0.8 cm.   Isthmus thickness is 0.2 cm.      Thyroid Size: Normal       Background Echotexture: Normal     Thyroid Nodules: There is an isoechoic 1.68 cm nodule in the right midpole, TR 3. This is similar to the prior scan        IMPRESSION:  1.68 cm right thyroid nodule compared to 1.54 cm on the prior scan. Suggest follow-up sonography in 2 years       Patient History:  Patient Active Problem List   Diagnosis    Asthma without status asthmaticus    GERD (gastroesophageal reflux disease)    Headache    History of kidney stones    Hypertension    Microscopic hematuria    Recurrent major depressive disorder (CMS HCC)    Situational anxiety    Thickened endometrium    Hypothyroidism    Tremor    Vitamin D deficiency    UTI (urinary tract infection)    Acute pain of left knee    Antibiotic-associated diarrhea    Bereavement, uncomplicated    Chronic sinusitis    Deviated septum    Disorder of gallbladder    Disorder of  lumbosacral intervertebral disc    Displacement of lumbar intervertebral disc without myelopathy    Diverticular disease of colon    Flat foot    Generalized osteoarthritis    History of tonsillitis    Indigestion    Lumbosacral spondylosis without myelopathy    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    Other ill-defined and unknown causes of morbidity and mortality    Other screening breast examination    Polyp of colon     Current Outpatient Medications   Medication Sig    atenoloL (TENORMIN) 25 mg Oral Tablet Take 1 Tablet (25 mg total) by mouth Once a day    BIOTIN ORAL Take 6,000 mcg by mouth Once a day    cholecalciferol, vitamin D3, 25 mcg (1,000 unit) Oral Tablet Take 2 Tablets (2,000 Units total) by mouth Once a day    cyanocobalamin (VITAMIN B12)  1,000 mcg/mL Injection Solution Inject 1 mL (1,000 mcg total) into the muscle Every 14 days Every 2 wks    doxycycline hyclate (VIBRAMYCIN) 100 mg Oral Capsule Take 1 Capsule (100 mg total) by mouth Once a day    fluticasone propionate (FLONASE) 50 mcg/actuation Nasal Spray, Suspension Administer 1 Spray into each nostril Twice per day as needed    Ibuprofen (MOTRIN) 800 mg Oral Tablet Take 1 Tablet (800 mg total) by mouth Twice per day as needed    levothyroxine (SYNTHROID) 75 mcg Oral Tablet Take 1 Tablet (75 mcg total) by mouth Once a day    montelukast (SINGULAIR) 10 mg Oral Tablet Take 1 Tablet (10 mg total) by mouth Every evening    mupirocin (BACTROBAN) 2 % Ointment Apply topically Twice daily    prenatal vitamin-iron-folate Tablet Take 1 Tablet by mouth Once a day    traMADoL (ULTRAM) 50 mg Oral Tablet Take 1 Tablet (50 mg total) by mouth Every 4 hours as needed for Pain    UNKNOWN MEDICATION (UNKNOWN MEDICATION) Take 10 mg by mouth Once a day      Allergies   Allergen Reactions    Latex Itching    Sulfa (Sulfonamides) Swelling and Angioedema     Past Medical History:   Diagnosis Date    Arrhythmia     Cancer (CMS HCC)     basil cell skin     Cardiac disease 07/26/2021    Disorder of thyroid     Dyslipidemia     Esophageal reflux     Fluttering sensation of heart     Hiatal hernia     Hx of renal calculi     Hypertension     Palpitations     Thyroid condition     UTI (urinary tract infection) 07/26/2021     Past Surgical History:   Procedure Laterality Date    ABDOMINAL SURGERY      BOWEL BLOCKAGE    COLONOSCOPY      DENTAL SURGERY      HEMORROIDECTOMY      HX CATARACT REMOVAL      HX CHOLECYSTECTOMY      HX CYSTOSCOPY      BIOPSY OF UTERUS    HX EYE SURGERY      HX THYROID BIOPSY      HX TONSILLECTOMY      HX TUBAL LIGATION      KNEE ARTHROSCOPY Right     KNEE SURGERY      laproscopic    ORTHOPEDIC SURGERY Right     BROKEN RIGHT ARM     Family Medical History:       Problem Relation (Age of Onset)    Breast Cancer Sister    COPD Other    Congestive Heart Failure Other    Coronary Artery Disease Father    Elevated Lipids Other    Heart Attack Other    Heart Disease Brother    Hypertension (High Blood Pressure) Father    Kidney Stones Brother    No Known Problems Maternal Aunt, Maternal Uncle, Paternal Aunt, Paternal Uncle, Maternal Grandmother, Maternal Grandfather, Paternal Grandmother, Paternal Grandfather, Daughter, Son    Primary Brain tumor Sister    Stroke Mother            Social History     Tobacco Use    Smoking status: Never    Smokeless tobacco: Never   Vaping Use    Vaping status: Never Used   Substance  Use Topics    Alcohol use: Never    Drug use: Never       Review of Systems:  Review of Systems    Physical Exam:  Ht 1.676 m (5\' 6" )   Wt 73.5 kg (162 lb)   BMI 26.15 kg/m       ENT Physical Exam  Constitutional  Appearance: patient appears well-developed, well-nourished and well-groomed,  Communication/Voice: communication appropriate for developmental age; vocal quality normal;  Head and Face  Appearance: head appears normal, face appears normal and face appears atraumatic;  Palpation: facial palpation normal;  Salivary: glands  normal;  Ear  Hearing: intact;  Auricles: right auricle normal; left auricle normal;  External Mastoids: right external mastoid normal; left external mastoid normal;  Ear Canals: right ear canal impacted cerumen observed; left ear canal normal;  Tympanic Membranes: right tympanic membrane normal; left tympanic membrane normal;  Nose  External Nose: nares patent bilaterally; external nose normal;  Internal Nose: nasal mucosa normal; septum normal; bilateral inferior turbinates normal;  Nose comments: L ant septum small hemorrhagic vessel  Oral Cavity/Oropharynx  Lips: normal;  Teeth: normal;  Gums: gingiva normal;  Tongue: normal;  Oral mucosa: normal;  Hard palate: normal;  Neck  Neck: neck normal; neck palpation normal;  Thyroid: thyroid normal;  Respiratory  Inspection: breathing unlabored; normal breathing rate;  Lymphatic  Palpation: lymph nodes normal;  Neurovestibular  Mental Status: alert and oriented;  Psychiatric: mood normal; affect is appropriate;  Cranial Nerves: cranial nerves intact;       Assessment:  ENCOUNTER DIAGNOSES     ICD-10-CM   1. 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 Allergic rhinitis, unspecified seasonality, unspecified trigger  J30.9   3. 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 Nasal vestibulitis  J34.89   6. Impacted cerumen of right ear  H61.21       Plan:  Medical records reviewed on 10/04/2022.  Reviewed Thyroid US-- showed 1.68 cm R thyroid nodule (was 1.54cm prior). Will order audiogram. Rx mupirocin. Hold off on Flonase for a couple weeks. AD cerumen debrided.   Orders Placed This Encounter    31575 - LARYNGOSCOPY, FLEXIBLE DIAGNOSTIC (AMB ONLY)    AMB PRN REFERRAL EXTERNAL AUDIOLOGIST    mupirocin (BACTROBAN) 2 % Ointment     Return for Follow up after audiogram.    Marcelline Deist, PA-C  The advanced practice clinician's documentation was reviewed/amended in its entirety with the assessment and plan portion completely performed independently by me during  this separate encounter.

## 2022-10-04 NOTE — Procedures (Signed)
[facility]  [address]  [place] [address]    Procedure Note    Name: Tonya Francis MRN:  [id number]   Date: 10/04/2022 DOB:  November 10, 1955 (66 y.o.)         31575 - LARYNGOSCOPY, FLEXIBLE DIAGNOSTIC (AMB ONLY)    Performed by: Conchita Paris, DO  Authorized by: Conchita Paris, DO    Time Out:     Immediately before the procedure, a time out was called:  Yes    Patient verified:  Yes    Procedure Verified:  Yes    Site Verified:  Yes  Documentation:      [facility]  [address] [place] [address]    Procedure Note    Name: Tonya Francis MRN:  [id number]  Date: 10/04/2022 DOB:  Jan 13, 1956 (66 y.o.)        @PROCDOC @    Indications for procedure: Head / Neck masses    Anesthesia: Oxymetazoline nasal spray    Description: The flexible endoscope was gently introduced into the nostril and passed along the floor of the nose to the nasopharynx. Adenoid was minimal and eustachian tubes normal. The retropalatal airway was patent.    The endoscope was passed to the oropharynx. Base of tongue displayed normal lingual tonsils, patent valelulla, and sharply defined upright epiglottis. Retrolingual airway was patent.    The larynx displayed normal true vocal cords with good mobility. False cords were normal. Arytenoid mucosa was pink with no edema.     The piriform recesses were symmetric without secretion. The hypopharynx was symmetric without lesion.    Findings: Symmetrical true vocal fold motion    The patient tolerated the procedure well.    Conchita Paris, DO                 Wyman Meschke [place], DO

## 2022-10-05 ENCOUNTER — Telehealth (INDEPENDENT_AMBULATORY_CARE_PROVIDER_SITE_OTHER): Payer: Self-pay | Admitting: OTOLARYNGOLOGY

## 2022-10-05 NOTE — Telephone Encounter (Signed)
Left message informing the patient of date and time of audio and follow up.

## 2022-10-31 ENCOUNTER — Other Ambulatory Visit: Payer: Self-pay

## 2022-10-31 ENCOUNTER — Other Ambulatory Visit: Payer: Medicare Other | Attending: FAMILY PRACTICE

## 2022-10-31 DIAGNOSIS — R519 Headache, unspecified: Secondary | ICD-10-CM | POA: Insufficient documentation

## 2022-10-31 DIAGNOSIS — I10 Essential (primary) hypertension: Secondary | ICD-10-CM | POA: Insufficient documentation

## 2022-10-31 DIAGNOSIS — E039 Hypothyroidism, unspecified: Secondary | ICD-10-CM | POA: Insufficient documentation

## 2022-10-31 LAB — CBC WITH DIFF
BASOPHIL #: 0 10*3/uL (ref 0.00–0.10)
BASOPHIL %: 1 % (ref 0–1)
EOSINOPHIL #: 0.1 10*3/uL (ref 0.00–0.50)
EOSINOPHIL %: 2 %
HCT: 43.3 % — ABNORMAL HIGH (ref 31.2–41.9)
HGB: 14.7 g/dL — ABNORMAL HIGH (ref 10.9–14.3)
LYMPHOCYTE #: 1.4 10*3/uL (ref 1.00–3.00)
LYMPHOCYTE %: 21 % (ref 16–44)
MCH: 31.4 pg (ref 24.7–32.8)
MCHC: 34.1 g/dL (ref 32.3–35.6)
MCV: 92.1 fL (ref 75.5–95.3)
MONOCYTE #: 0.7 10*3/uL (ref 0.30–1.00)
MONOCYTE %: 10 % (ref 5–13)
MPV: 8.5 fL (ref 7.9–10.8)
NEUTROPHIL #: 4.5 10*3/uL (ref 1.85–7.80)
NEUTROPHIL %: 67 % (ref 43–77)
PLATELETS: 200 10*3/uL (ref 140–440)
RBC: 4.7 10*6/uL (ref 3.63–4.92)
RDW: 13.6 % (ref 12.3–17.7)
WBC: 6.7 10*3/uL (ref 3.8–11.8)

## 2022-10-31 LAB — HEPATIC FUNCTION PANEL
ALBUMIN/GLOBULIN RATIO: 1.7 — ABNORMAL HIGH (ref 0.8–1.4)
ALBUMIN: 4.3 g/dL (ref 3.5–5.7)
ALKALINE PHOSPHATASE: 95 U/L (ref 34–104)
ALT (SGPT): 20 U/L (ref 7–52)
AST (SGOT): 20 U/L (ref 13–39)
BILIRUBIN DIRECT: 0.11 md/dL (ref <=0.20)
BILIRUBIN TOTAL: 0.8 mg/dL (ref 0.3–1.2)
BILIRUBIN, INDIRECT: 0.69 mg/dL (ref <=1)
GLOBULIN: 2.6 — ABNORMAL LOW (ref 2.9–5.4)
PROTEIN TOTAL: 6.9 g/dL (ref 6.4–8.9)

## 2022-10-31 LAB — THYROID STIMULATING HORMONE (SENSITIVE TSH): TSH: 0.589 u[IU]/mL (ref 0.450–5.330)

## 2022-10-31 LAB — BASIC METABOLIC PANEL
ANION GAP: 9 mmol/L (ref 4–13)
BUN/CREA RATIO: 17 (ref 6–22)
BUN: 13 mg/dL (ref 7–25)
CALCIUM: 9.8 mg/dL (ref 8.6–10.3)
CHLORIDE: 104 mmol/L (ref 98–107)
CO2 TOTAL: 29 mmol/L (ref 21–31)
CREATININE: 0.77 mg/dL (ref 0.60–1.30)
ESTIMATED GFR: 84 mL/min/{1.73_m2} (ref >59)
GLUCOSE: 104 mg/dL (ref 74–109)
OSMOLALITY, CALCULATED: 284 mOsm/kg (ref 270–290)
POTASSIUM: 3.9 mmol/L (ref 3.5–5.1)
SODIUM: 142 mmol/L (ref 136–145)

## 2022-10-31 LAB — LIPID PANEL
CHOL/HDL RATIO: 4.2
CHOLESTEROL: 198 mg/dL (ref <200)
HDL CHOL: 47 mg/dL (ref >=40)
LDL CALC: 98 mg/dL (ref 0–100)
TRIGLYCERIDES: 264 mg/dL — ABNORMAL HIGH (ref <=150)
VLDL CALC: 53 mg/dL — ABNORMAL HIGH (ref 0–50)

## 2022-11-14 ENCOUNTER — Other Ambulatory Visit: Payer: Self-pay

## 2022-11-14 ENCOUNTER — Other Ambulatory Visit: Payer: Medicare Other | Attending: FAMILY PRACTICE

## 2022-11-14 DIAGNOSIS — D582 Other hemoglobinopathies: Secondary | ICD-10-CM | POA: Insufficient documentation

## 2022-11-14 LAB — CBC WITH DIFF
BASOPHIL #: 0 10*3/uL (ref 0.00–0.10)
BASOPHIL %: 1 % (ref 0–1)
EOSINOPHIL #: 0.1 10*3/uL (ref 0.00–0.50)
EOSINOPHIL %: 2 %
HCT: 42.4 % — ABNORMAL HIGH (ref 31.2–41.9)
HGB: 14.5 g/dL — ABNORMAL HIGH (ref 10.9–14.3)
LYMPHOCYTE #: 1.5 10*3/uL (ref 1.00–3.00)
LYMPHOCYTE %: 25 % (ref 16–44)
MCH: 31.6 pg (ref 24.7–32.8)
MCHC: 34.2 g/dL (ref 32.3–35.6)
MCV: 92.2 fL (ref 75.5–95.3)
MONOCYTE #: 0.7 10*3/uL (ref 0.30–1.00)
MONOCYTE %: 12 % (ref 5–13)
MPV: 8 fL (ref 7.9–10.8)
NEUTROPHIL #: 3.6 10*3/uL (ref 1.85–7.80)
NEUTROPHIL %: 60 % (ref 43–77)
PLATELETS: 243 10*3/uL (ref 140–440)
RBC: 4.6 10*6/uL (ref 3.63–4.92)
RDW: 13.8 % (ref 12.3–17.7)
WBC: 6 10*3/uL (ref 3.8–11.8)

## 2022-11-23 ENCOUNTER — Ambulatory Visit (HOSPITAL_COMMUNITY)
Admission: RE | Admit: 2022-11-23 | Discharge: 2022-11-23 | Disposition: A | Discharge status: Home / Self Care | Payer: Self-pay | Source: Ambulatory Visit

## 2022-11-23 IMAGING — MR MRI BRAIN WITHOUT AND WITH CONTRAST
10 of 11 series · 38 of 48 positions shown · IV contrast (gadavist)
Comparison: None available.

﻿EXAM:  MRI BRAIN WITHOUT AND WITH CONTRAST
INDICATION: 66-year-old with chronic headache.  No history of trauma or malignancy or previous surgery.
TECHNIQUE: Multiplanar, multisequential MRI of the brain was performed without and with 5 mL of Gadavist.

[Series 5: DWI · axial · 5.0mm · 1.35mm/px · z∈[-46,+80]mm · 9 of 88 slices shown (1 of 3)]
[im 1/88]
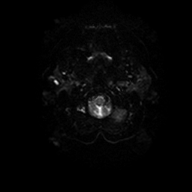
[im 15/88]
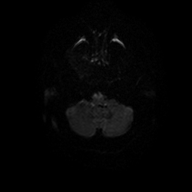
[im 30/88]
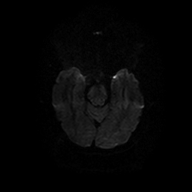
[im 37/88]
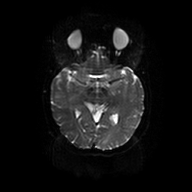
[im 44/88]
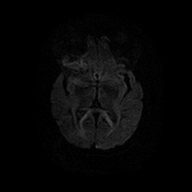
[im 51/88]
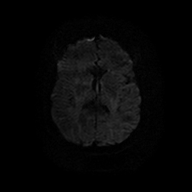
[im 59/88]
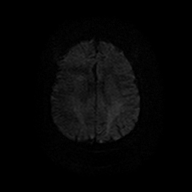
[im 73/88]
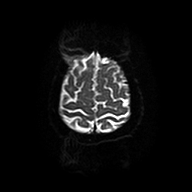
[im 88/88]
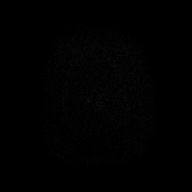

[Series 6: DWI · axial · 5.0mm · 1.35mm/px · z∈[-46,+80]mm · 3 of 22 slices shown (2 of 3)]
[im 1/22]
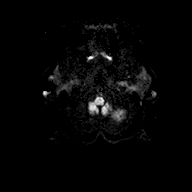
[im 11/22]
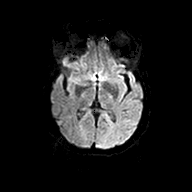
[im 22/22]
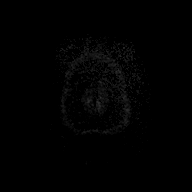

[Series 7: DWI · axial · 5.0mm · 1.35mm/px · z∈[-46,+80]mm · 3 of 22 slices shown (3 of 3)]
[im 1/22]
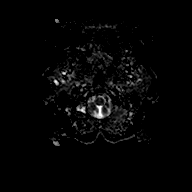
[im 11/22]
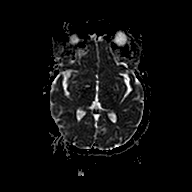
[im 22/22]
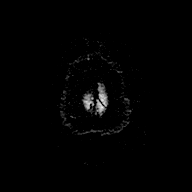

[Series 8: FLAIR · sagittal · 4.0mm · 0.75mm/px · 4 of 26 slices shown (1 of 2)]
[im 1/26]
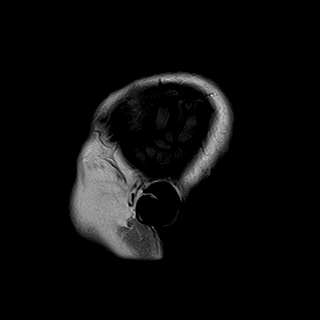
[im 9/26]
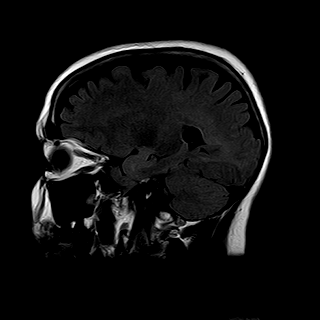
[im 17/26]
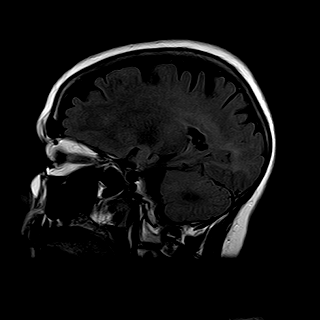
[im 26/26]
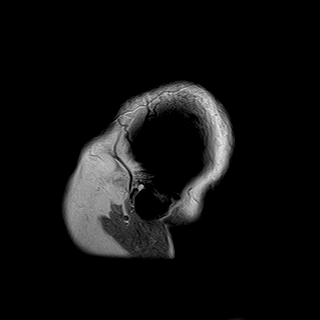

[Series 9: T2 · axial · 5.0mm · 0.43mm/px · z∈[-55,+83]mm · 4 of 24 slices shown (1 of 2)]
[im 1/24]
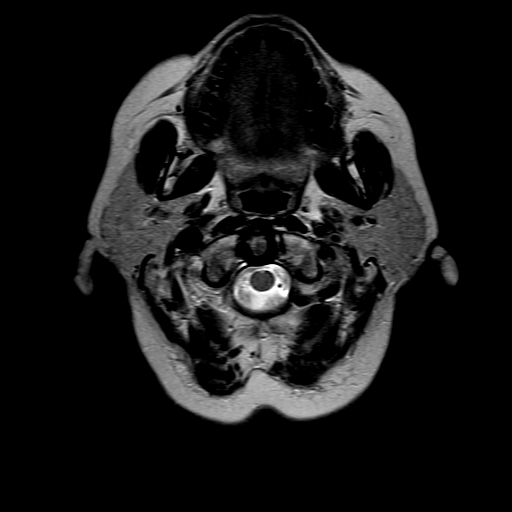
[im 8/24]
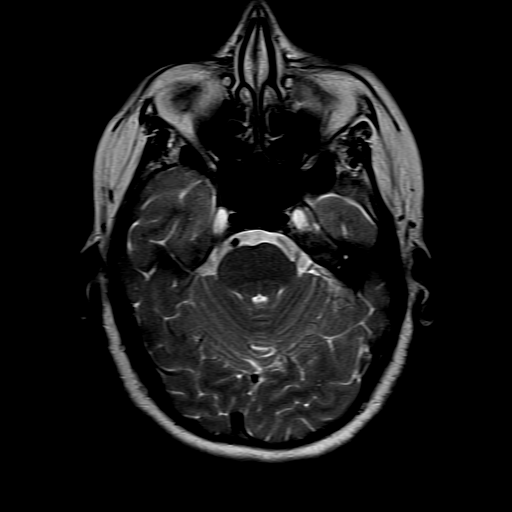
[im 16/24]
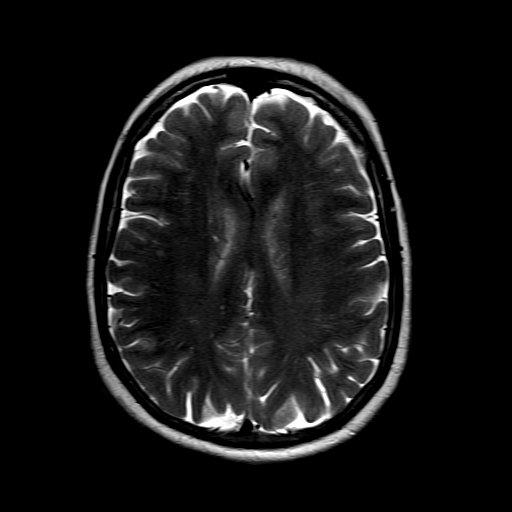
[im 24/24]
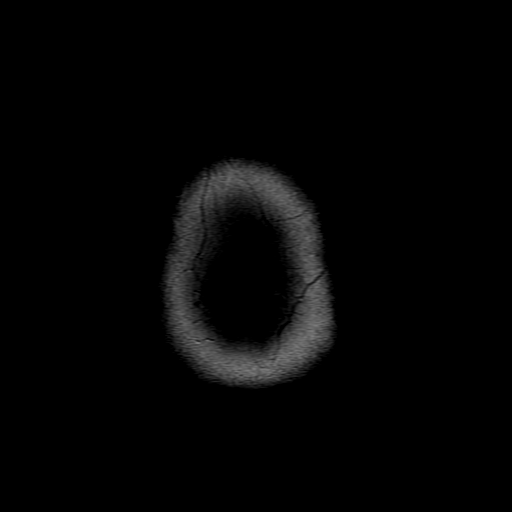

[Series 10: FLAIR · axial · 5.0mm · 0.76mm/px · z∈[-49,+77]mm · 3 of 22 slices shown (2 of 2)]
[im 1/22]
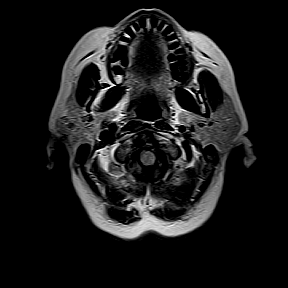
[im 11/22]
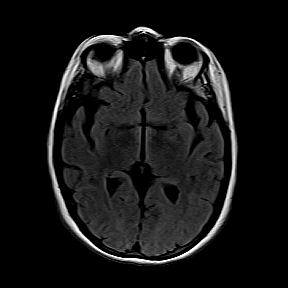
[im 22/22]
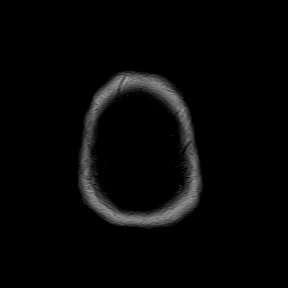

[Series 11: T1 · axial · 5.0mm · 0.69mm/px · z∈[-49,+77]mm · 3 of 22 slices shown]
[im 1/22]
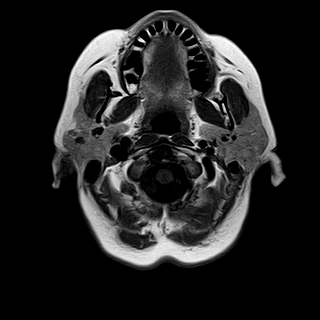
[im 11/22]
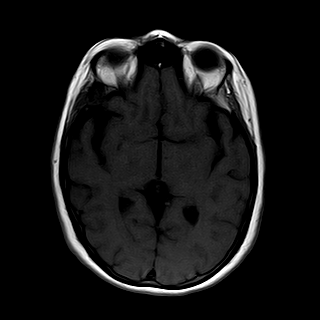
[im 22/22]
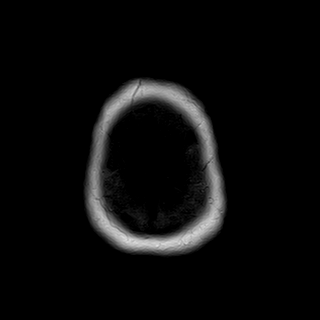

[Series 13: T2 · coronal · 6.0mm · 0.43mm/px · 4 of 24 slices shown (2 of 2)]
[im 1/24]
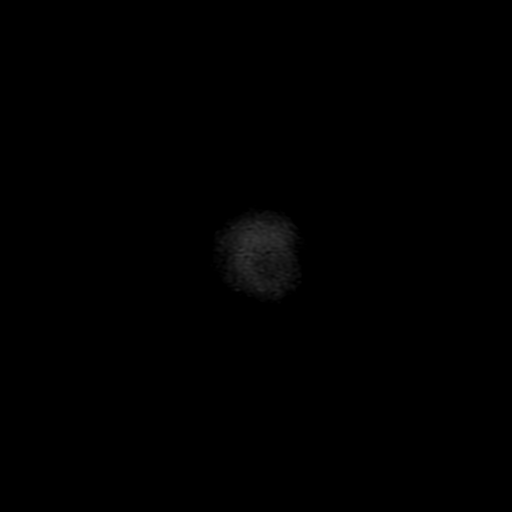
[im 8/24]
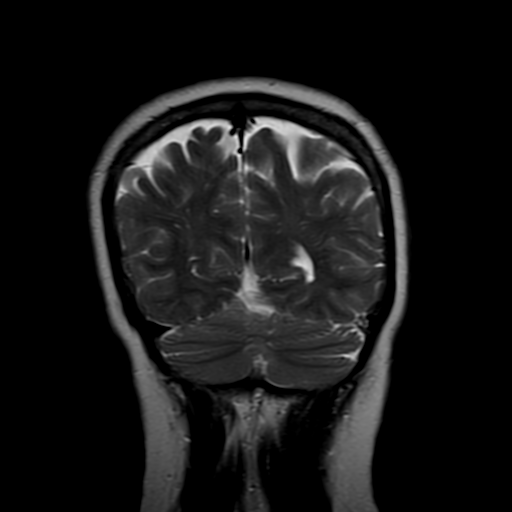
[im 16/24]
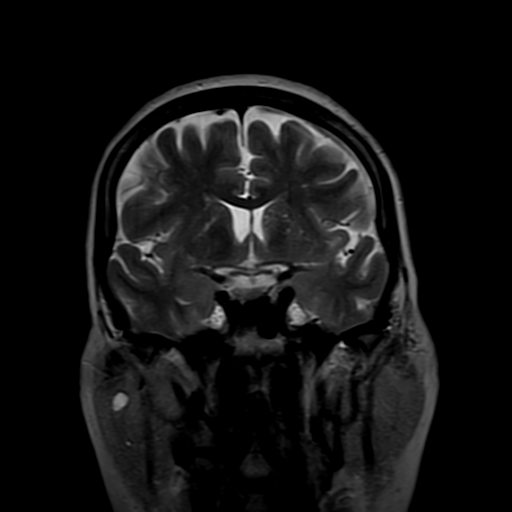
[im 24/24]
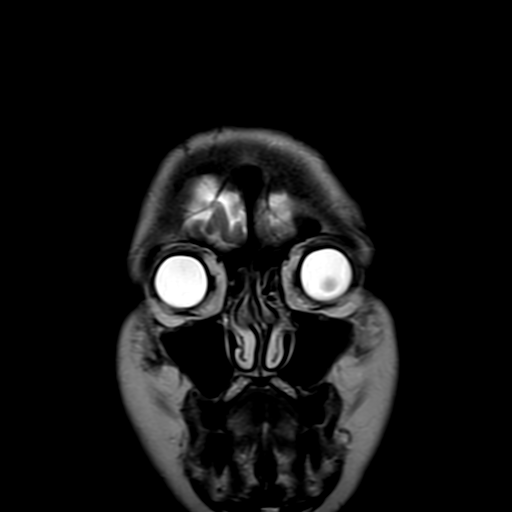

[Series 14: T1 post-contrast · axial · 5.0mm · 0.43mm/px · z∈[-58,+86]mm · 4 of 25 slices shown]
[im 1/25]
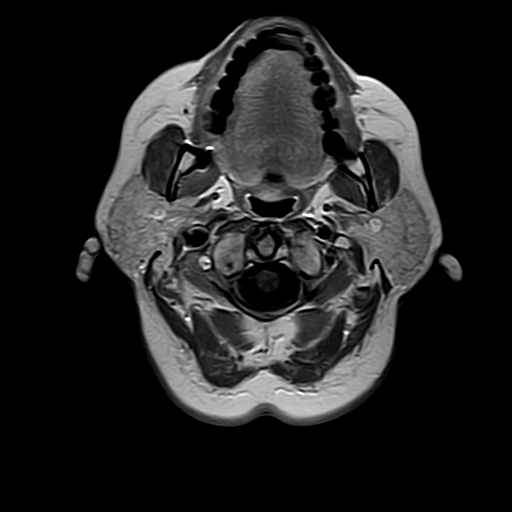
[im 9/25]
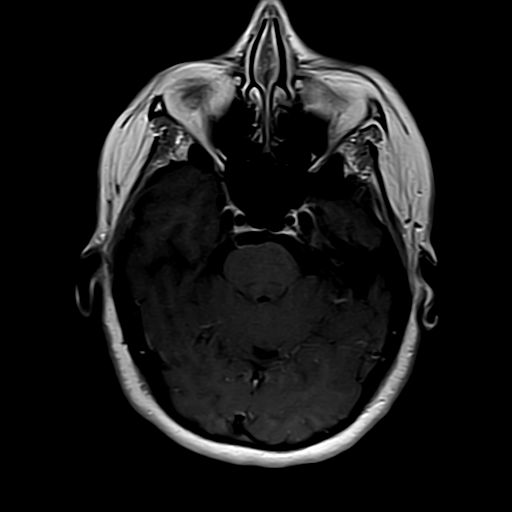
[im 17/25]
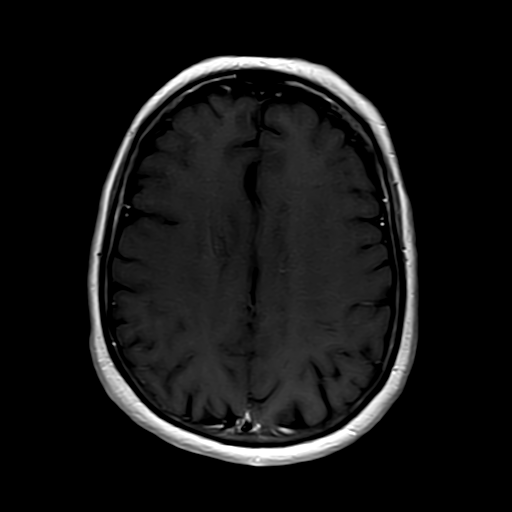
[im 25/25]
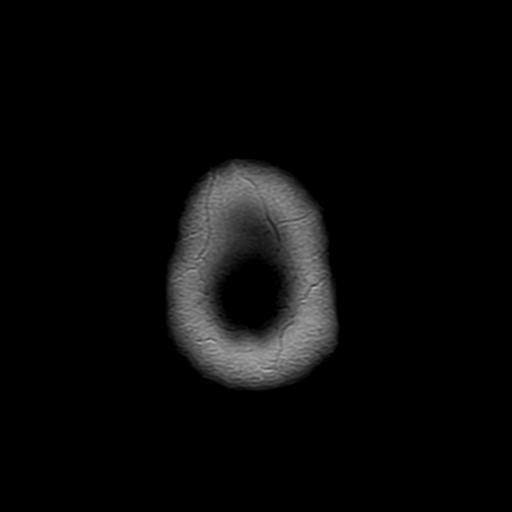

[Series 15: T1 fat-sat post-contrast · coronal · 5.0mm · 0.57mm/px · 1 of 24 slices shown]
[im 1/24]
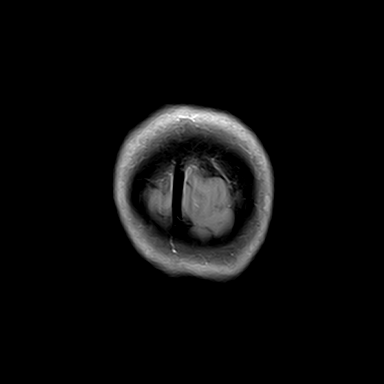

[38 of 48 positions shown; findings below may reference images not displayed]

FINDINGS: No focal areas of restricted diffusion.  No intracranial bleed or extra-axial collections are seen.  No evidence of ventriculomegaly or midline shift.  Major arteries of circle of Willis and dural venous sinuses are patent.  Minimal chronic small-vessel ischemic change of subcortical white matter on FLAIR images.  No evidence of demyelinating lesions of the brain.

Pituitary gland is normal in appearance.  No abnormal enhancement on postcontrast examination. 

Mild sinusitis with retention cyst in the right maxillary sinus.  Small well-defined 5 mm cyst within the right parotid salivary gland.
IMPRESSION: 1. No intracranial space-occupying lesions or abnormal enhancement.

2. No evidence of acute ischemia or intracranial bleed.  No ventriculomegaly.

3. Normal pituitary gland.  

4. Mild sinusitis of maxillary sinuses with chronic retention cyst.

5. Well-defined cyst within the right parotid salivary gland 5 mm in size likely benign.  Follow-up by ultrasound of the parotid salivary gland on the right side in 6 months is recommended.

## 2022-12-02 IMAGING — CT CT PELVIS W/CONTRAST
2 of 4 series · 17 of 46 positions shown, 19 images · IV contrast (CONTRAST)
Comparison: Outside ultrasound exam dated 06/16/2022.  CT pelvis dated 06/22/2021.  MRI pelvis dated 06/29/2022.

﻿EXAM:  CT PELVIS W/CONTRAST
INDICATION: 67-year-old postmenopausal female was evaluated with MRI examination on 06/29/2022 for finding of abnormal cervix and 1.3 cm size right adnexal cyst at outside examination of the pelvis by ultrasound.  No prior history of malignancy or GYN surgery.
TECHNIQUE: CT scan was performed after oral contrast and 70 cc of IV contrast and reviewed in multiple projections.  Radiation dose 487 mGy cm.  Images were reviewed in multiple windows and projections. Exam was performed using 1 or more of the following dose reduction techniques: Automated exposure control, adjustment of the mA and/or kV according to patient size, or the use of iterative reconstruction technique.

[ax wc · axial · 0.71mm/px · z∈[-892,-632]mm · 14 of 199 slices shown, 16 images]
[im 13/199  soft-tissue]
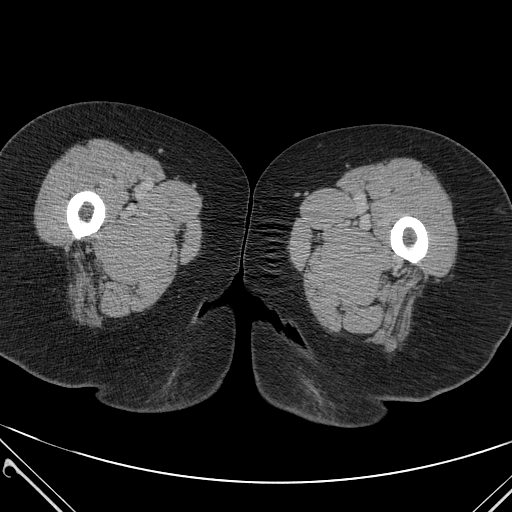
[im 13/199  bone]
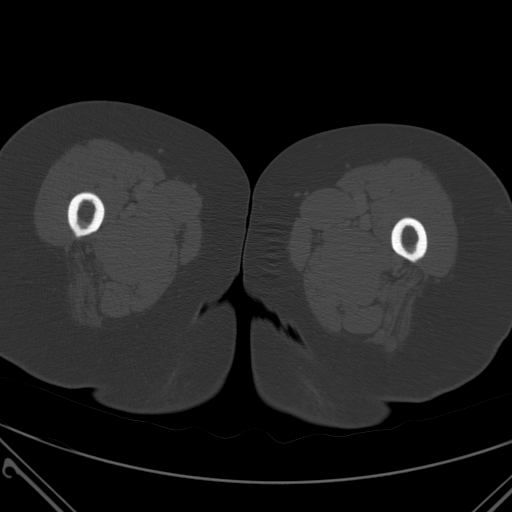
[im 26/199  soft-tissue]
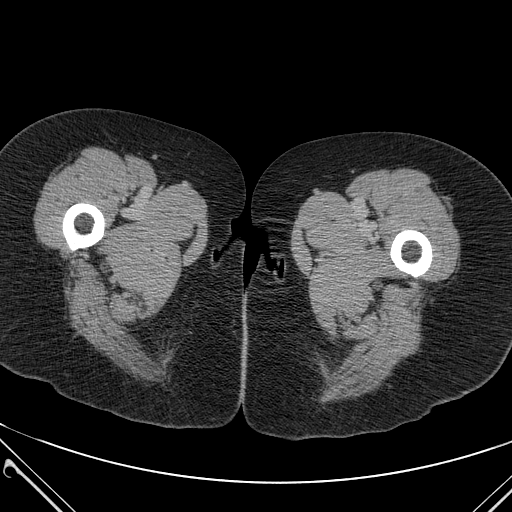
[im 39/199  soft-tissue]
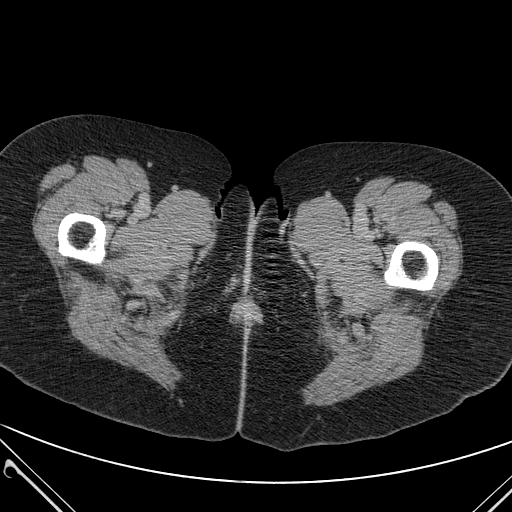
[im 52/199  soft-tissue]
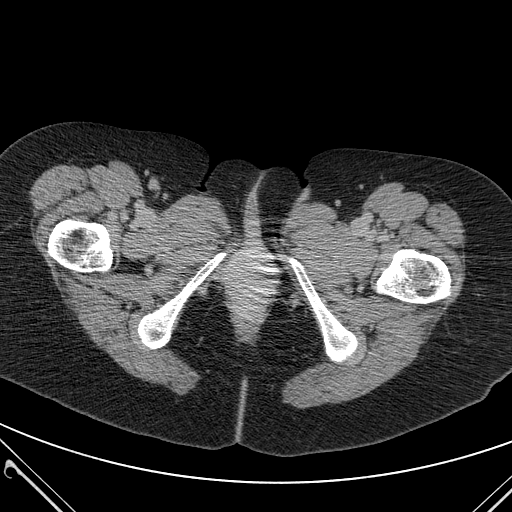
[im 64/199  soft-tissue]
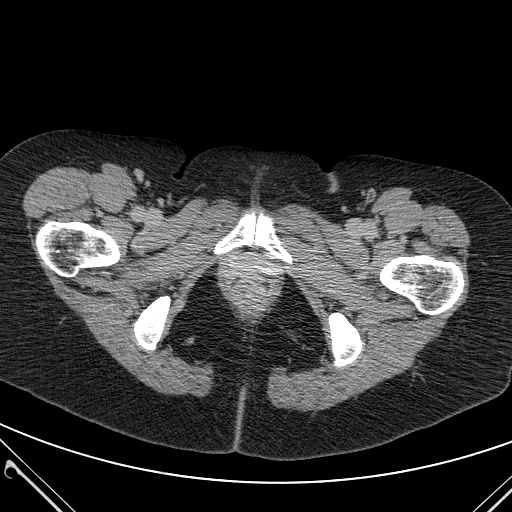
[im 77/199  soft-tissue]
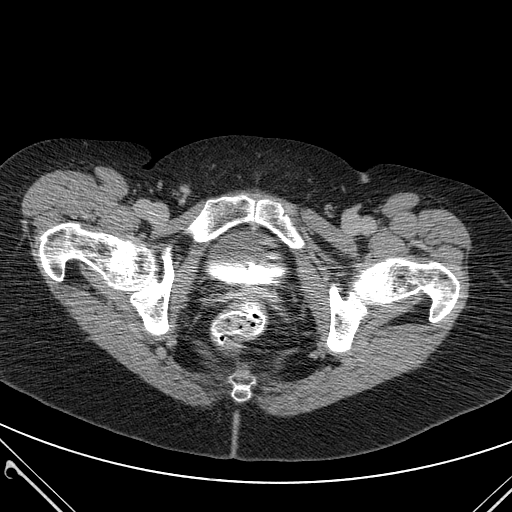
[im 90/199  soft-tissue]
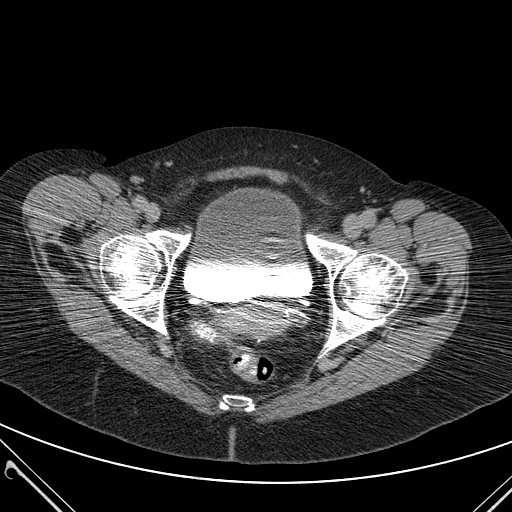
[im 109/199  soft-tissue]
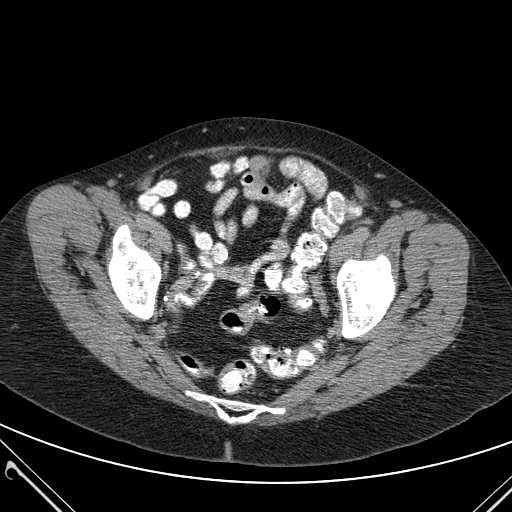
[im 122/199  soft-tissue]
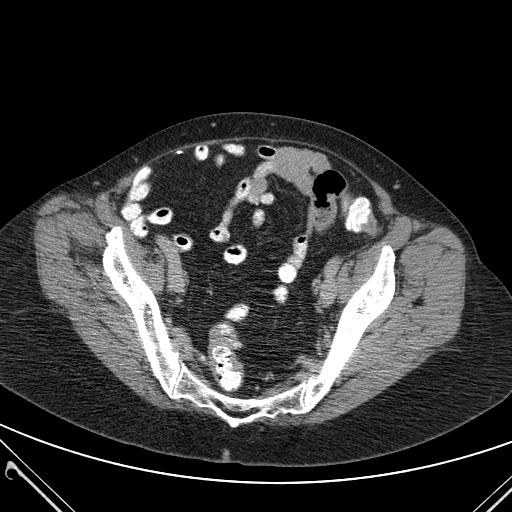
[im 122/199  bone]
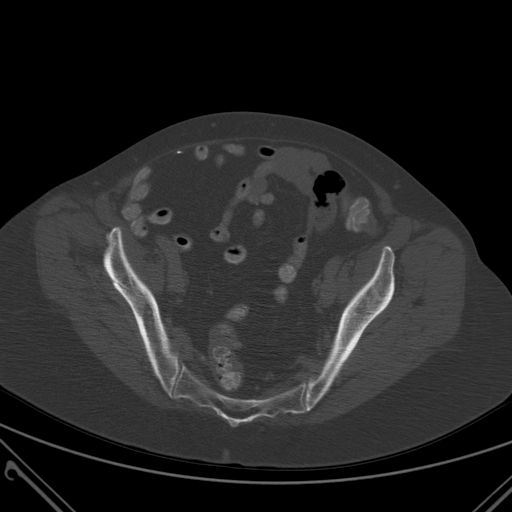
[im 135/199  soft-tissue]
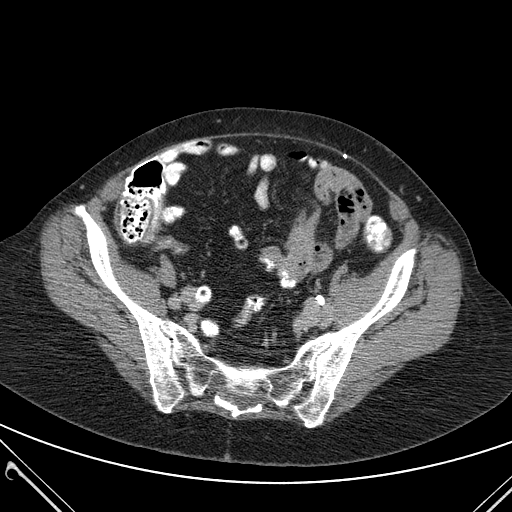
[im 147/199  soft-tissue]
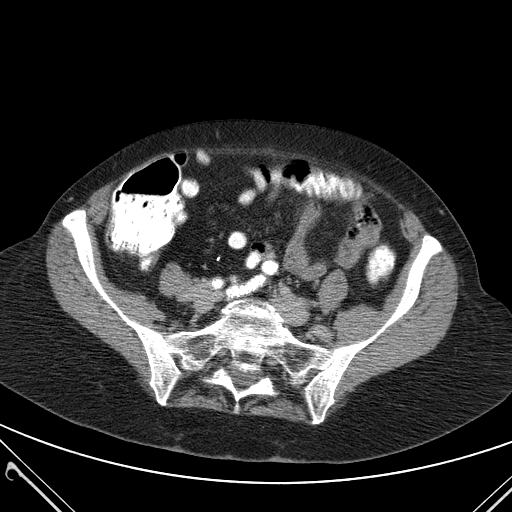
[im 160/199  soft-tissue]
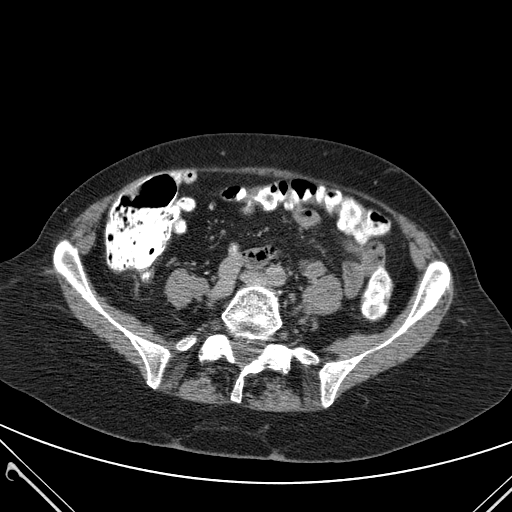
[im 173/199  soft-tissue]
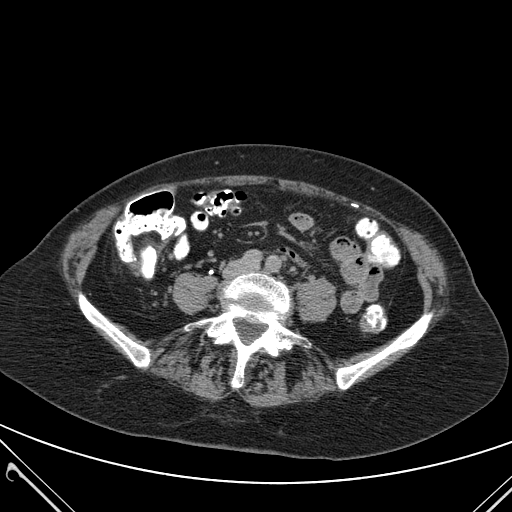
[im 186/199  soft-tissue]
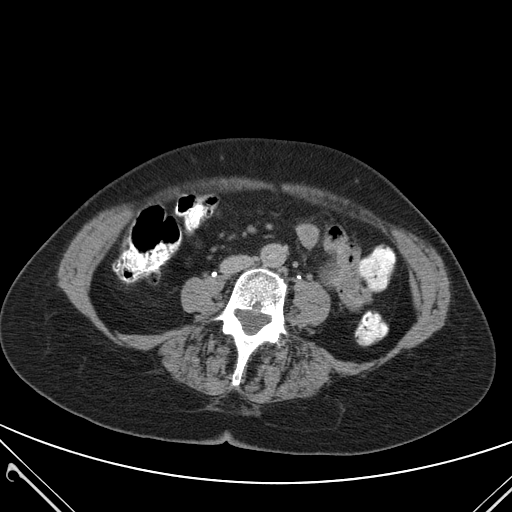

[cor · coronal · 0.71mm/px · 3 of 77 slices shown]
[im 26/77  soft-tissue]
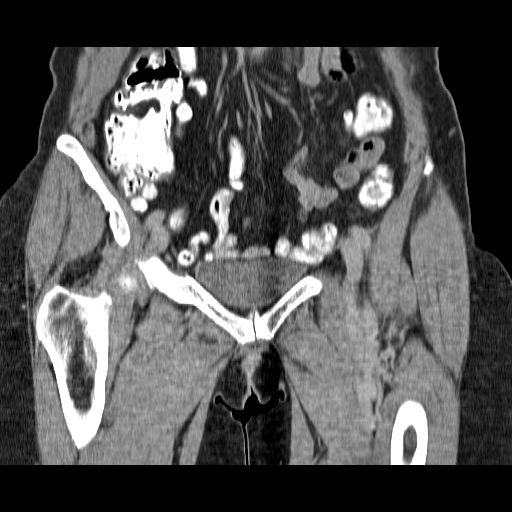
[im 34/77  soft-tissue]
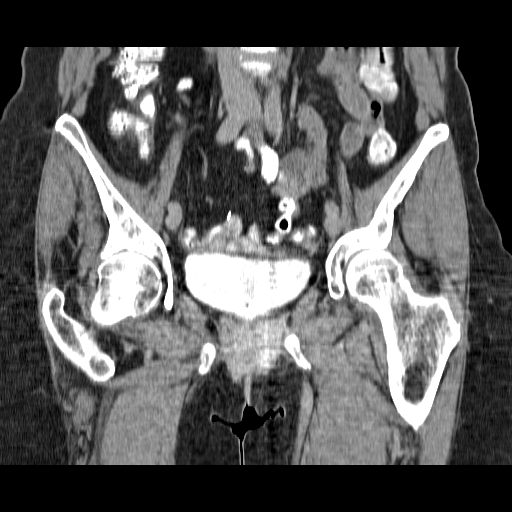
[im 43/77  soft-tissue]
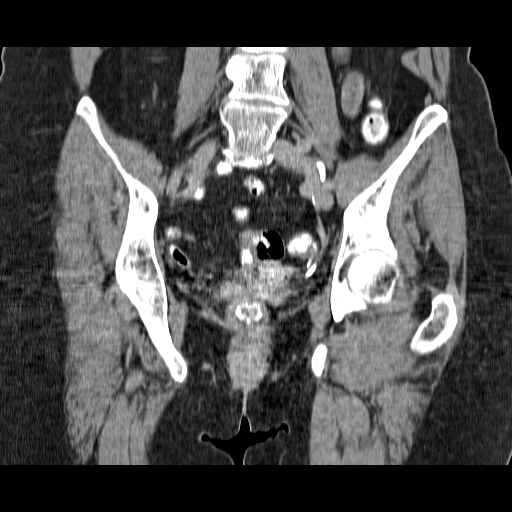

[17 of 46 positions shown; findings below may reference images not displayed]

FINDINGS: Normal postmenopausal size uterus is noted again measuring a length of 4.7 cm and a width of 2.1 cm.  No focal mass is noted in the region of lower uterine segment or cervix.

Postmenopausal size ovaries are stable on both sides, measuring approximately 2 cc in volume.  No pelvic adnexal mass or fluid collections are seen.  No focal lesions of the bladder are noted.  No pelvic lymphadenopathy or inguinal adenopathy is noted.
IMPRESSION: 1. Stable postmenopausal size uterus and ovaries.

2. No adnexal masses are seen. No lymphadenopathy or fluid collections are seen in the pelvis.

## 2022-12-08 ENCOUNTER — Other Ambulatory Visit: Payer: Self-pay

## 2022-12-08 ENCOUNTER — Ambulatory Visit (INDEPENDENT_AMBULATORY_CARE_PROVIDER_SITE_OTHER): Payer: Medicare Other | Admitting: OTOLARYNGOLOGY

## 2022-12-08 ENCOUNTER — Encounter (INDEPENDENT_AMBULATORY_CARE_PROVIDER_SITE_OTHER): Payer: Self-pay | Admitting: OTOLARYNGOLOGY

## 2022-12-08 VITALS — Ht 66.0 in | Wt 162.0 lb

## 2022-12-08 DIAGNOSIS — K118 Other diseases of salivary glands: Secondary | ICD-10-CM

## 2022-12-08 DIAGNOSIS — J342 Deviated nasal septum: Secondary | ICD-10-CM

## 2022-12-08 DIAGNOSIS — E041 Nontoxic single thyroid nodule: Secondary | ICD-10-CM

## 2022-12-08 DIAGNOSIS — H9312 Tinnitus, left ear: Secondary | ICD-10-CM

## 2022-12-08 DIAGNOSIS — J309 Allergic rhinitis, unspecified: Secondary | ICD-10-CM

## 2022-12-08 DIAGNOSIS — H903 Sensorineural hearing loss, bilateral: Secondary | ICD-10-CM

## 2022-12-08 DIAGNOSIS — K116 Mucocele of salivary gland: Secondary | ICD-10-CM

## 2022-12-08 NOTE — Addendum Note (Signed)
Addended by: Archie Endo on: 12/08/2022 09:48 AM     Modules accepted: Orders

## 2022-12-08 NOTE — H&P (Signed)
[facility]  [address]  [place] [address]    Progress Note    Name: Tonya Francis MRN:  [id number]   Date: 12/08/2022 DOB:  01-04-1956 (67 y.o.)              Follow Up      Subjective:   Chief Complaint:   Follow-up After Testing (Rc after audio. [facility]. (Requested results))       History of Present Illness:  Tonya Francis is a 67 y.o. old female who presents to the clinic for follow-up. Patient states she is still occasionally have have tinnitus. Patient states her nose is feeling better. Audiogram show mild HF SNHL. Au.  Tymps type  A AU. She did have MRI of the brain that showed 5 mm right parotid cyst.     Review of Systems     Physical Exam:     Vitals:    12/08/22 0910   Weight: 73.5 kg (162 lb)   Height: 1.676 m (5\' 6" )   BMI: 26.2      ENT Physical Exam  Constitutional  Appearance: patient appears well-developed, well-nourished and well-groomed,  Communication/Voice: communication appropriate for developmental age; vocal quality normal;  Head and Face  Appearance: head appears normal, face appears normal and face appears atraumatic;  Palpation: facial palpation normal;  Salivary: glands normal;  Ear  Hearing: intact;  Auricles: right auricle normal; left auricle normal;  External Mastoids: right external mastoid normal; left external mastoid normal;  Ear Canals: right ear canal normal; left ear canal normal;  Tympanic Membranes: right tympanic membrane normal; left tympanic membrane normal;  Nose  External Nose: nares patent bilaterally; external nose normal;  Internal Nose: nasal mucosa normal; nasal septal deviation present; bilateral inferior turbinates with hypertrophy;  Oral Cavity/Oropharynx  Lips: normal;  Teeth: normal;  Gums: gingiva normal;  Tongue: normal;  Oral mucosa: normal;  Hard palate: normal;  Neck  Neck: neck normal; neck palpation normal;  Thyroid: thyroid normal;  Respiratory  Inspection: breathing  unlabored; normal breathing rate;  Lymphatic  Palpation: lymph nodes normal;  Neurovestibular  Mental Status: alert and oriented;  Psychiatric: mood normal; affect is appropriate;  Cranial Nerves: cranial nerves intact;       Assessment and Plan:       ICD-10-CM    1. Right thyroid nodule  E04.1       2. Allergic rhinitis, unspecified seasonality, unspecified trigger  J30.9       3. 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       Follow up:  Return in about 6 months (around 06/10/2023).    Tonya Paris, DO

## 2022-12-14 ENCOUNTER — Encounter (INDEPENDENT_AMBULATORY_CARE_PROVIDER_SITE_OTHER): Payer: Self-pay | Admitting: NURSE PRACTITIONER

## 2022-12-14 ENCOUNTER — Ambulatory Visit (INDEPENDENT_AMBULATORY_CARE_PROVIDER_SITE_OTHER): Payer: Medicare Other | Admitting: NURSE PRACTITIONER

## 2022-12-14 ENCOUNTER — Other Ambulatory Visit: Payer: Self-pay

## 2022-12-14 VITALS — BP 144/79 | HR 65 | Ht 66.0 in | Wt 160.0 lb

## 2022-12-14 DIAGNOSIS — R002 Palpitations: Secondary | ICD-10-CM

## 2022-12-14 DIAGNOSIS — I10 Essential (primary) hypertension: Secondary | ICD-10-CM

## 2022-12-14 NOTE — Addendum Note (Signed)
Addended by: Cathey Endow on: 12/14/2022 04:11 PM     Modules accepted: Orders

## 2022-12-14 NOTE — Progress Notes (Signed)
[facility]iology Clinic [facility]    Name: Tonya Francis  Age: 67 y.o.  Date of Service: 12/14/2022    Primary Care Provider: Manuella Ghazi, DO  Chief Complaint:   Chief Complaint   Patient presents with    Follow Up     Annual  09.13.23 Echocardiogram for baseline       HPI:  The patient has no history of CAD.  She has history of hypertension, hypothyroidism, renal stones and family history of coronary artery disease.  She has issues with palpitations and takes atenolol. She bleeds easy on ASA daily and takes QOD.    12/22/21 The patient is here for yearly f/u.  She reports no specific chest pain or so breath, but she is been having episodes of intense palpitations, usually after she gets out of the shower.  She will become very diaphoretic and weak and symptoms will last seconds to several minutes and then resolve.  She also notices these episodes under high stress situations.  She admits that she is a very anxious person.  She is also concerned because she has family history odd cardiac anomalies.    12/14/2022 The patient is here for yearly f/u for palpitations.  She did have echocardiogram after last visit on 01/06/23 which showed EF 60% with mildly dilated left atrium and no significant valvular disease.  He denies any chest pains or shortness of breath.  She had some issues recently with elevated blood pressure and palpitations, but she was drinking tea daily as well as this causing her trouble.  She also had some severe dizziness and fall ENT and had a tilt-table test.  Once she stopped drinking the tea, her symptoms improved.  Her PCP did start her on a diuretic for blood pressure but she forgot name.  Blood pressures have been stable since then.  Labs:   Lipid panel in July showed total cholesterol 198, triglycerides 264, HDL 47, LDL 98.  Sodium level 142, potassium 39, BUN 13, creatinine 0.77, TSH unremarkable.  EKG:   Normal sinus rhythm    Past Medical History:  Past Medical History:    Diagnosis Date    Arrhythmia     Cancer (CMS HCC)     basil cell skin    Cardiac disease 07/26/2021    Disorder of thyroid     Dyslipidemia     Esophageal reflux     Fluttering sensation of heart     Hiatal hernia     Hx of renal calculi     Hypertension     Palpitations     Thyroid condition     UTI (urinary tract infection) 07/26/2021         Social History:  Social History     Tobacco Use   Smoking Status Never   Smokeless Tobacco Never      Current Medications:  Current Outpatient Medications   Medication Sig    atenoloL (TENORMIN) 25 mg Oral Tablet Take 1 Tablet (25 mg total) by mouth Once a day    BIOTIN ORAL Take 6,000 mcg by mouth Once a day    cholecalciferol, vitamin D3, 25 mcg (1,000 unit) Oral Tablet Take 2 Tablets (2,000 Units total) by mouth Once a day    cyanocobalamin (VITAMIN B12) 1,000 mcg/mL Injection Solution Inject 1 mL (1,000 mcg total) into the muscle Every 14 days Every 2 wks    doxycycline hyclate (VIBRAMYCIN) 100 mg Oral Capsule Take 1 Capsule (100 mg total) by  mouth Once a day    fluticasone propionate (FLONASE) 50 mcg/actuation Nasal Spray, Suspension Administer 1 Spray into each nostril Twice per day as needed    Ibuprofen (MOTRIN) 800 mg Oral Tablet Take 1 Tablet (800 mg total) by mouth Twice per day as needed    levothyroxine (SYNTHROID) 75 mcg Oral Tablet Take 1 Tablet (75 mcg total) by mouth Once a day    montelukast (SINGULAIR) 10 mg Oral Tablet Take 1 Tablet (10 mg total) by mouth Every evening    mupirocin (BACTROBAN) 2 % Ointment Apply topically Twice daily    prenatal vitamin-iron-folate Tablet Take 1 Tablet by mouth Once a day    traMADoL (ULTRAM) 50 mg Oral Tablet Take 1 Tablet (50 mg total) by mouth Every 4 hours as needed for Pain    UNKNOWN MEDICATION (UNKNOWN MEDICATION) Take 10 mg by mouth Once a day     Allergies:  Allergies   Allergen Reactions    Latex Itching    Sulfa (Sulfonamides) Swelling and Angioedema      Review of Systems:  Complete ROS was performed and  otherwise negative unless noted in HPI.    Vital Signs:  Vitals:    12/14/22 0900   BP: (!) 144/79   Pulse: 65   SpO2: 99%   Weight: 72.6 kg (160 lb)   Height: 1.676 m (5\' 6" )   BMI: 25.88      Physical Exam:  General: Pt resting comfortably in no acute distress and appears stated age.    Neck: No JVD, no carotid bruit. Neck supple, symmetrical, trachea midline.   Lungs:  Normal respiratory effort, lungs clear to auscultation bilaterally.    Cardiovascular:  Regular rate and rhythm.  Normal S1 and S2 without murmur, no gallop, or rub.  Abdomen: Soft, non-tender and bowel sounds normal.    Extremities: No edema.  Neurologic: Alert and oriented x3.     Assessment/Plan:  1. Palpitations  Continue atenolol 25 mg daily.  Echocardiogram stable.  Return in 1 year for routine follow-up.  - EKG (In-Clinic Today)    2. Hypertension, unspecified type  Continue atenolol 25 mg daily.  She is going to call the office and let us know the diuretic she is taking.  Continue follow up with PCP concerning blood pressure.  - EKG (In-Clinic Today)      Orders placed this visit:  Orders Placed This Encounter    EKG (In-Clinic Today)       Tonya Francis is to return to clinic for follow up with the understanding that should symptoms change or worsen she is to call the office or go to the closest emergency department for evaluation.    Amedeo Kinsman, APRN,FNP-BC    A portion of this documentation may have been generated using MMODAL voice recognition software and may contain syntax/voice recognition errors.

## 2022-12-15 ENCOUNTER — Inpatient Hospital Stay (HOSPITAL_BASED_OUTPATIENT_CLINIC_OR_DEPARTMENT_OTHER)
Admission: RE | Admit: 2022-12-15 | Discharge: 2022-12-15 | Disposition: A | Discharge status: Home / Self Care | Payer: Medicare Other | Source: Ambulatory Visit | Attending: FAMILY PRACTICE

## 2022-12-15 ENCOUNTER — Ambulatory Visit
Admission: RE | Admit: 2022-12-15 | Discharge: 2022-12-15 | Disposition: A | Discharge status: Home / Self Care | Payer: Medicare Other | Source: Ambulatory Visit | Attending: FAMILY PRACTICE | Admitting: FAMILY PRACTICE

## 2022-12-15 ENCOUNTER — Encounter (HOSPITAL_COMMUNITY): Payer: Self-pay

## 2022-12-15 DIAGNOSIS — M8580 Other specified disorders of bone density and structure, unspecified site: Secondary | ICD-10-CM

## 2022-12-15 DIAGNOSIS — Z1231 Encounter for screening mammogram for malignant neoplasm of breast: Secondary | ICD-10-CM

## 2022-12-15 DIAGNOSIS — Z780 Asymptomatic menopausal state: Secondary | ICD-10-CM | POA: Insufficient documentation

## 2022-12-15 DIAGNOSIS — M8588 Other specified disorders of bone density and structure, other site: Secondary | ICD-10-CM

## 2022-12-17 DIAGNOSIS — Z1231 Encounter for screening mammogram for malignant neoplasm of breast: Secondary | ICD-10-CM

## 2022-12-21 ENCOUNTER — Encounter (INDEPENDENT_AMBULATORY_CARE_PROVIDER_SITE_OTHER): Payer: Self-pay | Admitting: NURSE PRACTITIONER

## 2022-12-21 LAB — ECG W INTERP (AMB USE ONLY)(MUSE,IN CLINIC)
Atrial Rate: 65 {beats}/min
Calculated P Axis: 73 degrees
Calculated R Axis: -2 degrees
Calculated T Axis: 69 degrees
PR Interval: 142 ms
QRS Duration: 82 ms
QT Interval: 442 ms
QTC Calculation: 459 ms
Ventricular rate: 65 {beats}/min

## 2023-02-03 ENCOUNTER — Encounter (INDEPENDENT_AMBULATORY_CARE_PROVIDER_SITE_OTHER): Payer: Self-pay | Admitting: PHYSICIAN ASSISTANT

## 2023-02-09 ENCOUNTER — Encounter (INDEPENDENT_AMBULATORY_CARE_PROVIDER_SITE_OTHER): Payer: Self-pay | Admitting: PHYSICIAN ASSISTANT

## 2023-02-17 ENCOUNTER — Encounter (INDEPENDENT_AMBULATORY_CARE_PROVIDER_SITE_OTHER): Payer: Self-pay | Admitting: Student in an Organized Health Care Education/Training Program

## 2023-03-13 ENCOUNTER — Other Ambulatory Visit: Payer: Medicare Other | Attending: PHYSICIAN ASSISTANT | Admitting: PHYSICIAN ASSISTANT

## 2023-03-13 ENCOUNTER — Ambulatory Visit (INDEPENDENT_AMBULATORY_CARE_PROVIDER_SITE_OTHER): Payer: Medicare Other | Admitting: PHYSICIAN ASSISTANT

## 2023-03-13 ENCOUNTER — Encounter (INDEPENDENT_AMBULATORY_CARE_PROVIDER_SITE_OTHER): Payer: Self-pay | Admitting: PHYSICIAN ASSISTANT

## 2023-03-13 ENCOUNTER — Other Ambulatory Visit: Payer: Self-pay

## 2023-03-13 VITALS — BP 127/72 | HR 55 | Temp 97.5°F | Resp 16 | Ht 66.0 in | Wt 160.0 lb

## 2023-03-13 DIAGNOSIS — I10 Essential (primary) hypertension: Secondary | ICD-10-CM | POA: Insufficient documentation

## 2023-03-13 DIAGNOSIS — Z760 Encounter for issue of repeat prescription: Secondary | ICD-10-CM

## 2023-03-13 DIAGNOSIS — E039 Hypothyroidism, unspecified: Secondary | ICD-10-CM

## 2023-03-13 DIAGNOSIS — Z87442 Personal history of urinary calculi: Secondary | ICD-10-CM

## 2023-03-13 DIAGNOSIS — R3129 Other microscopic hematuria: Secondary | ICD-10-CM

## 2023-03-13 DIAGNOSIS — Z79890 Hormone replacement therapy (postmenopausal): Secondary | ICD-10-CM

## 2023-03-13 DIAGNOSIS — R911 Solitary pulmonary nodule: Secondary | ICD-10-CM

## 2023-03-13 LAB — THYROID STIMULATING HORMONE (SENSITIVE TSH): TSH: 1.678 u[IU]/mL (ref 0.450–5.330)

## 2023-03-13 LAB — BASIC METABOLIC PANEL
ANION GAP: 7 mmol/L (ref 4–13)
BUN/CREA RATIO: 19 (ref 6–22)
BUN: 15 mg/dL (ref 7–25)
CALCIUM: 9.8 mg/dL (ref 8.6–10.3)
CHLORIDE: 106 mmol/L (ref 98–107)
CO2 TOTAL: 30 mmol/L (ref 21–31)
CREATININE: 0.78 mg/dL (ref 0.60–1.30)
ESTIMATED GFR: 82 mL/min/{1.73_m2} (ref >59)
GLUCOSE: 104 mg/dL (ref 74–109)
OSMOLALITY, CALCULATED: 286 mosm/kg (ref 270–290)
POTASSIUM: 3.8 mmol/L (ref 3.5–5.1)
SODIUM: 143 mmol/L (ref 136–145)

## 2023-03-13 LAB — MAGNESIUM: MAGNESIUM: 1.9 mg/dL (ref 1.9–2.7)

## 2023-03-13 MED ORDER — CYANOCOBALAMIN (VIT B-12) 1,000 MCG/ML INJECTION SOLUTION
1000.0000 ug | INTRAMUSCULAR | 0 refills | Status: DC
Start: 2023-03-13 — End: 2023-11-13

## 2023-03-13 MED ORDER — TRIAMCINOLONE ACETONIDE 0.1 % TOPICAL OINTMENT
TOPICAL_OINTMENT | Freq: Two times a day (BID) | CUTANEOUS | 2 refills | Status: AC | PRN
Start: 2023-03-13 — End: 2023-06-11

## 2023-03-13 MED ORDER — DOXYCYCLINE HYCLATE 100 MG CAPSULE
100.0000 mg | ORAL_CAPSULE | Freq: Every day | ORAL | 0 refills | Status: DC
Start: 2023-03-13 — End: 2023-06-20

## 2023-03-13 MED ORDER — FLUTICASONE PROPIONATE 50 MCG/ACTUATION NASAL SPRAY,SUSPENSION
1.0000 | Freq: Two times a day (BID) | NASAL | 1 refills | Status: DC | PRN
Start: 2023-03-13 — End: 2023-07-10

## 2023-03-13 NOTE — Assessment & Plan Note (Signed)
Orders:    MAGNESIUM; Future    BASIC METABOLIC PANEL; Future

## 2023-03-13 NOTE — Progress Notes (Signed)
FAMILY MEDICINE, MEDICAL OFFICE BUILDING  [address]  [place] [address]      Progress Note    Tonya Francis  Dec 01, 1955  [id number]    Date of Service: 03/13/2023  8:00 AM EST    Chief complaint:   Chief Complaint   Patient presents with    Follow Up     Patient is here today for medication refills and wants to discuss a OTC medication. She states at last office visit she was started on HCTZ she wants to see if she needs to continue on it.        Subjective:     This is a case of a 67 y.o. year old female who comes in today for follow up. Pt reports BP is doing ok with the highest 140/80 w/HCTZ. Pt. Brings in her CT of ABD/pelvis from the [facility] from 2012 and shows a small 3mm pulmonary nodule. Pt. Reports Doxycyline is doing great for her rosacea. Pt. Sees Dr. Leighton Parody and had injection in right knee. Pt. Wants to see the [facility] in [place] for her kidney stones and h/o microhematuria. Pt. Follows with Dr. Hulan Saas for thyroid and nasal lesion.  Dr. Barney Drain office also.    Review of Systems   Constitutional: Negative.    HENT:  Positive for postnasal drip, rhinorrhea and sneezing. Negative for congestion, ear pain, sore throat and trouble swallowing.    Respiratory: Negative.     Cardiovascular: Negative.    Gastrointestinal:  Negative for anal bleeding, constipation, diarrhea, nausea and vomiting.   Psychiatric/Behavioral:  The patient is nervous/anxious.         Anxious at times       Current Outpatient Medications   Medication Sig    atenoloL (TENORMIN) 25 mg Oral Tablet Take 1 Tablet (25 mg total) by mouth Once a day    BIOTIN ORAL Take 6,000 mcg by mouth Once a day    cholecalciferol, vitamin D3, 25 mcg (1,000 unit) Oral Tablet Take 2 Tablets (2,000 Units total) by mouth Once a day    cyanocobalamin (VITAMIN B12) 1,000 mcg/mL Injection Solution Inject 1 mL (1,000 mcg total) into the muscle Every 14 days Every 2 wks    doxycycline hyclate (VIBRAMYCIN) 100 mg Oral Capsule Take 1 Capsule (100  mg total) by mouth Once a day    esomeprazole magnesium (NEXIUM) 40 mg Oral Capsule, Delayed Release(E.C.) Take 1 Capsule (40 mg total) by mouth Every morning before breakfast    fluticasone propionate (FLONASE) 50 mcg/actuation Nasal Spray, Suspension Administer 1 Spray into each nostril Twice per day as needed    hydroCHLOROthiazide (HYDRODIURIL) 12.5 mg Oral Tablet Take 1 Tablet (12.5 mg total) by mouth Once a day    Ibuprofen (MOTRIN) 800 mg Oral Tablet Take 1 Tablet (800 mg total) by mouth Twice per day as needed    levothyroxine (SYNTHROID) 75 mcg Oral Tablet Take 1 Tablet (75 mcg total) by mouth Once a day    loratadine (CLARITIN) 10 mg Oral Tablet Take 1 Tablet (10 mg total) by mouth Once a day OTC    montelukast (SINGULAIR) 10 mg Oral Tablet Take 1 Tablet (10 mg total) by mouth Every evening    multivit-min/folic ac/collagen (WOMEN'S MULTIVITAMIN COLLAGEN ORAL) Take 1 Scoop by mouth Once a day    mupirocin (BACTROBAN) 2 % Ointment Apply topically Twice daily    prenatal vitamin-iron-folate Tablet Take 1 Tablet by mouth Once a day    traMADoL (ULTRAM) 50 mg  Oral Tablet Take 1 Tablet (50 mg total) by mouth Every 4 hours as needed for Pain (Patient not taking: Reported on 03/13/2023)    triamcinolone acetonide 0.1 % Ointment Apply topically Twice per day as needed for up to 90 days    UNKNOWN MEDICATION (UNKNOWN MEDICATION) Take 10 mg by mouth Once a day       Objective:     BP 127/72 (Site: Left Arm, Patient Position: Sitting, Cuff Size: Adult)   Pulse 55   Temp 36.4 C (97.5 F) (Temporal)   Resp 16   Ht 1.676 m (5\' 6" )   Wt 72.6 kg (160 lb)   SpO2 100%   BMI 25.82 kg/m       Physical Exam  Constitutional:       Appearance: Normal appearance.   HENT:      Head: Normocephalic and atraumatic.   Eyes:      Extraocular Movements: Extraocular movements intact.   Cardiovascular:      Rate and Rhythm: Normal rate and regular rhythm.      Pulses: Normal pulses.      Heart sounds: Normal heart sounds. No  murmur heard.  Pulmonary:      Effort: Pulmonary effort is normal.      Breath sounds: Normal breath sounds.   Abdominal:      General: Abdomen is flat. Bowel sounds are normal.      Palpations: Abdomen is soft.   Musculoskeletal:         General: Normal range of motion.      Right lower leg: No edema.      Left lower leg: No edema.   Skin:     General: Skin is warm and dry.   Neurological:      General: No focal deficit present.      Mental Status: She is alert and oriented to person, place, and time.   Psychiatric:         Mood and Affect: Mood normal.         Thought Content: Thought content normal.           Assessment & Plan  Hx of renal calculi    Orders:    Referral to External Provider    Microhematuria    Orders:    Referral to External Provider    Pulmonary nodule    Orders:    CT CHEST WO IV CONTRAST; Future    Hypothyroidism, unspecified type    Orders:    THYROID STIMULATING HORMONE (SENSITIVE TSH); Future    Hypertension, unspecified type    Orders:    MAGNESIUM; Future    BASIC METABOLIC PANEL; Future       Other orders    cyanocobalamin (VITAMIN B12) 1,000 mcg/mL Injection Solution; Inject 1 mL (1,000 mcg total) into the muscle Every 14 days Every 2 wks    fluticasone propionate (FLONASE) 50 mcg/actuation Nasal Spray, Suspension; Administer 1 Spray into each nostril Twice per day as needed    doxycycline hyclate (VIBRAMYCIN) 100 mg Oral Capsule; Take 1 Capsule (100 mg total) by mouth Once a day    triamcinolone acetonide 0.1 % Ointment; Apply topically Twice per day as needed for up to 90 days          The patient was given the opportunity to ask questions and those questions were answered to the patient's satisfaction. The patient was encouraged to call with any additional questions or concerns.    Follow up: Return  in about 3 months (around 06/13/2023).    Dewayne Shorter, PA-C

## 2023-03-13 NOTE — Assessment & Plan Note (Signed)
Orders:    THYROID STIMULATING HORMONE (SENSITIVE TSH); Future

## 2023-03-13 NOTE — Nursing Note (Signed)
03/13/23 0814   Recent Weight Change   Have you had a recent unexplained weight loss or gain? N   Domestic Violence   Because we are aware of abuse and domestic violence today, we ask all patients: Are you being hurt, hit, or frightened by anyone at your home or in your life?  N   Basic Needs   Do you have any basic needs within your home that are not being met? (such as Food, Shelter, [employer] fast streamer, Tranportation, paying for bills and/or medications) N

## 2023-03-13 NOTE — Nursing Note (Signed)
03/13/23 0815   PHQ 9 (follow up)   Little interest or pleasure in doing things. 0   Feeling down, depressed, or hopeless 0   Trouble falling or staying asleep, or sleeping too much. 3   Feeling tired or having little energy 0   Poor appetite or overeating 0   Feeling bad about yourself/ that you are a failure in the past 2 weeks? 0   Trouble concentrating on things in the past 2 weeks? 0   Moving/Speaking slowly or being fidgety or restless  in the past 2 weeks? 0   Thoughts that you would be better off DEAD, or of hurting yourself in some way. 0   If you checked off any problems, how difficult have these problems made it for you to do your work, take care of things at home, or get along with other people? Not difficult at all   PHQ 9 Total 3   Interpretation of Total Score No depression

## 2023-03-16 ENCOUNTER — Other Ambulatory Visit (INDEPENDENT_AMBULATORY_CARE_PROVIDER_SITE_OTHER): Payer: Self-pay

## 2023-03-17 ENCOUNTER — Encounter (INDEPENDENT_AMBULATORY_CARE_PROVIDER_SITE_OTHER): Payer: Self-pay | Admitting: PHYSICIAN ASSISTANT

## 2023-03-17 NOTE — Telephone Encounter (Signed)
Patient was on voicemail- she states her blood pressure has been running 130's/70's sometimes the diastolic is in the 80's. She states Kassie told her to call and let us know after stopping her fluid pill.

## 2023-04-03 ENCOUNTER — Inpatient Hospital Stay
Admission: RE | Admit: 2023-04-03 | Discharge: 2023-04-03 | Disposition: A | Discharge status: Home / Self Care | Payer: Medicare Other | Source: Ambulatory Visit | Attending: PHYSICIAN ASSISTANT | Admitting: PHYSICIAN ASSISTANT

## 2023-04-03 ENCOUNTER — Encounter (INDEPENDENT_AMBULATORY_CARE_PROVIDER_SITE_OTHER): Payer: Self-pay | Admitting: PHYSICIAN ASSISTANT

## 2023-04-03 ENCOUNTER — Other Ambulatory Visit: Payer: Self-pay

## 2023-04-03 DIAGNOSIS — R911 Solitary pulmonary nodule: Secondary | ICD-10-CM | POA: Insufficient documentation

## 2023-04-11 ENCOUNTER — Ambulatory Visit (INDEPENDENT_AMBULATORY_CARE_PROVIDER_SITE_OTHER): Payer: Medicare Other | Admitting: PHYSICIAN ASSISTANT

## 2023-04-11 ENCOUNTER — Other Ambulatory Visit: Payer: Self-pay

## 2023-04-11 ENCOUNTER — Encounter (INDEPENDENT_AMBULATORY_CARE_PROVIDER_SITE_OTHER): Payer: Self-pay | Admitting: PHYSICIAN ASSISTANT

## 2023-04-11 VITALS — BP 124/80 | HR 86 | Temp 100.3°F | Ht 66.0 in | Wt 160.0 lb

## 2023-04-11 DIAGNOSIS — R067 Sneezing: Secondary | ICD-10-CM

## 2023-04-11 DIAGNOSIS — I10 Essential (primary) hypertension: Secondary | ICD-10-CM

## 2023-04-11 DIAGNOSIS — U071 COVID-19: Secondary | ICD-10-CM

## 2023-04-11 DIAGNOSIS — R509 Fever, unspecified: Secondary | ICD-10-CM

## 2023-04-11 DIAGNOSIS — R0981 Nasal congestion: Secondary | ICD-10-CM

## 2023-04-11 LAB — POCT RAPID COVID-19 & FLU (AMB ONLY)
COVID-19 AG: POSITIVE — AB
INFLUENZA TYPE A: NEGATIVE
INFLUENZA TYPE B: NEGATIVE

## 2023-04-11 MED ORDER — HYDROCHLOROTHIAZIDE 12.5 MG TABLET
12.5000 mg | ORAL_TABLET | Freq: Every day | ORAL | 0 refills | Status: DC
Start: 2023-04-11 — End: 2024-01-03

## 2023-04-11 MED ORDER — HYDROCHLOROTHIAZIDE 12.5 MG TABLET
12.5000 mg | ORAL_TABLET | Freq: Every day | ORAL | 0 refills | Status: DC
Start: 2023-04-11 — End: 2023-04-11

## 2023-04-11 MED ORDER — NIRMATRELVIR 300 MG (150 MG X2)-RITONAVIR 100 MG TABLET,DOSE PACK
3.0000 | ORAL_TABLET | Freq: Two times a day (BID) | ORAL | 0 refills | Status: DC
Start: 2023-04-11 — End: 2023-04-11

## 2023-04-11 MED ORDER — NIRMATRELVIR 300 MG (150 MG X2)-RITONAVIR 100 MG TABLET,DOSE PACK
3.0000 | ORAL_TABLET | Freq: Two times a day (BID) | ORAL | 0 refills | Status: AC
Start: 2023-04-11 — End: 2023-04-16

## 2023-04-11 NOTE — Nursing Note (Signed)
04/11/23 1700   Rapid Flu   Time Performed 1724   Rapid Flu A Result Negative   Rapid Flu B Result Negative   Influenza Culture Sent No   POCT Instrument [place]

## 2023-04-11 NOTE — Progress Notes (Signed)
FAMILY MEDICINE, MEDICAL OFFICE BUILDING  [address]  [place] [address]      Tonya Francis  01-14-1956  [id number]    Date of Service: 04/11/2023  5:00 PM EST    Chief complaint:   Chief Complaint   Patient presents with    High Blood Pressure     Pt has a log of BP from 12/16 and today. Took them frequently throughout today. Also states she started sneezing yesterday all through the evening, denies sore throat. Took saline spray, congested.        Subjective:      67 y.o. year old female who comes in today for elevated blood pressure. She brings in log of BP readings from the past couple weeks which range from 110s-170 systolic and 60-80's diastolic. She stopped her HCTZ about one month ago. She denies chest pain, SOB, recent heart flutters. States swelling in ankles worse if she consumes salt.     She also notes she used a saline nose spray because she had excessive sneezing, congestion, and chills last night. Mild cough. Denies sore throat, ear pain, SOB. Denies known exposure to illness other than exposure to the general public at the mall.     Recent lab results reviewed and noted.  BASIC METABOLIC PANEL  Lab Results   Component Value Date    SODIUM 143 03/13/2023    POTASSIUM 3.8 03/13/2023    CHLORIDE 106 03/13/2023    CO2 30 03/13/2023    ANIONGAP 7 03/13/2023    BUN 15 03/13/2023    CREATININE 0.78 03/13/2023    BUNCRRATIO 19 03/13/2023    GFR 82 03/13/2023    CALCIUM 9.8 03/13/2023    GLUCOSE neg 07/26/2021    GLUCOSENF 104 03/13/2023      THYROID STIMULATING HORMONE  Lab Results   Component Value Date    TSH 1.678 03/13/2023       ROS:  Reviewed as negative unless otherwise mentioned in the HPI.    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    History of tonsillitis 09/21/2021    Indigestion 09/21/2021    Lumbosacral spondylosis without myelopathy 09/21/2021    Other ill-defined and unknown causes of morbidity and mortality 09/21/2021    Other screening breast examination 09/21/2021    Asthma without status asthmaticus 07/26/2021    Headache 07/26/2021    History of kidney stones 07/26/2021    Microscopic hematuria 07/26/2021    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    Hypothyroidism 07/26/2021    Tremor 07/26/2021    UTI (urinary tract infection) 07/26/2021    GERD (gastroesophageal reflux disease) 02/03/2021    Thickened endometrium 12/17/2018    Acute pain of left knee 06/28/2018    Hypertension 04/05/2018    Situational anxiety 04/05/2018    Vitamin D deficiency 04/05/2018    Antibiotic-associated diarrhea 04/05/2018    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 04/05/2018    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    Polyp of colon 12/20/2007       Past Surgical History:   Procedure Laterality Date    ABDOMINAL  SURGERY      BOWEL BLOCKAGE    COLONOSCOPY      2014, 2023    DENTAL SURGERY      HEMORROIDECTOMY      HX CATARACT REMOVAL      HX CHOLECYSTECTOMY      HX CYSTOSCOPY      BIOPSY OF UTERUS    HX EYE SURGERY      HX THYROID BIOPSY      FNA Bx Benign 2021    HX TONSILLECTOMY      HX TUBAL LIGATION      KNEE ARTHROSCOPY Right     KNEE SURGERY      laproscopic    ORTHOPEDIC SURGERY Right     BROKEN RIGHT ARM           Current Outpatient Medications   Medication Sig    atenoloL (TENORMIN) 25 mg Oral Tablet Take 1 Tablet (25 mg total) by mouth Once a day    BIOTIN ORAL Take 6,000 mcg by mouth Once a day    cholecalciferol, vitamin D3, 25 mcg (1,000 unit) Oral Tablet Take 2 Tablets (2,000 Units total) by mouth Once a day    cyanocobalamin (VITAMIN B12) 1,000 mcg/mL Injection Solution Inject 1 mL (1,000 mcg total)  into the muscle Every 14 days Every 2 wks    doxycycline hyclate (VIBRAMYCIN) 100 mg Oral Capsule Take 1 Capsule (100 mg total) by mouth Once a day    esomeprazole magnesium (NEXIUM) 40 mg Oral Capsule, Delayed Release(E.C.) Take 1 Capsule (40 mg total) by mouth Every morning before breakfast    fluticasone propionate (FLONASE) 50 mcg/actuation Nasal Spray, Suspension Administer 1 Spray into each nostril Twice per day as needed    hydroCHLOROthiazide (HYDRODIURIL) 12.5 mg Oral Tablet Take 1 Tablet (12.5 mg total) by mouth Once a day for 30 days    Ibuprofen (MOTRIN) 800 mg Oral Tablet Take 1 Tablet (800 mg total) by mouth Twice per day as needed    levothyroxine (SYNTHROID) 75 mcg Oral Tablet Take 1 Tablet (75 mcg total) by mouth Once a day    loratadine (CLARITIN) 10 mg Oral Tablet Take 1 Tablet (10 mg total) by mouth Once a day OTC    montelukast (SINGULAIR) 10 mg Oral Tablet Take 1 Tablet (10 mg total) by mouth Every evening    multivit-min/folic ac/collagen (WOMEN'S MULTIVITAMIN COLLAGEN ORAL) Take 1 Scoop by mouth Once a day    mupirocin (BACTROBAN) 2 % Ointment Apply topically Twice daily    nirmatrelvir-ritonavir (PAXLOVID) 300 mg (150 mg x 2)-100mg  tablet therapy pack Take 3 Tablets by mouth Twice daily for 5 days    prenatal vitamin-iron-folate Tablet Take 1 Tablet by mouth Once a day    traMADoL (ULTRAM) 50 mg Oral Tablet Take 1 Tablet (50 mg total) by mouth Every 4 hours as needed for Pain (Patient not taking: Reported on 03/13/2023)    triamcinolone acetonide 0.1 % Ointment Apply topically Twice per day as needed for up to 90 days    UNKNOWN MEDICATION (UNKNOWN MEDICATION) Take 10 mg by mouth Once a day       Objective:     BP 124/80   Pulse 86   Temp 37.9 C (100.3 F) (Oral)   Ht 1.676 m (5\' 6" )   Wt 72.6 kg (160 lb)   SpO2 96%   BMI 25.82 kg/m        General appearance: alert, cooperative, in no acute distress  Head: normocephalic, atraumatic.  Eyes: PERRL, EOMI.   ENT: Ears: canals and TMs  intact bilaterally. Nose: patent. Yellowish rhinorrhea. Throat: erythematous with drainage.   Neck: no LAD or thyromegaly, supple.  Lungs: clear to auscultation bilaterally; no wheezes or rhonchi   Heart: regular rate and rhythm; no murmur  Extremities: intact x 4. No edema or rash noted.  Psych: alert and oriented x 3  Neuro: CN 2-12 grossly intact    Assessment/Plan     Assessment & Plan  COVID-19  Rapid flu and covid positive for COVID. Will start her on antiviral therapy secondary to her risk factors. Advised quarantine 5-7 days from onset. Strongly advised to be reevaluated if symptoms increase.   Orders:    nirmatrelvir-ritonavir (PAXLOVID) 300 mg (150 mg x 2)-100mg  tablet therapy pack; Take 3 Tablets by mouth Twice daily for 5 days    Hypertension, unspecified type  Will restart her HCTZ and have her monitor BP at home and keep log  Orders:    hydroCHLOROthiazide (HYDRODIURIL) 12.5 mg Oral Tablet; Take 1 Tablet (12.5 mg total) by mouth Once a day for 30 days                     The patient was given the opportunity to ask questions and those questions were answered to the patient's satisfaction. The patient was encouraged to call with any additional questions or concerns. Instructed patient to call back if symptoms worse.   Discussed with patient effects and side effects of medications. Medication safety was discussed. A copy of the patient's medication list was printed and given to the patient. A good faith effort was made to reconcile the patient's medications.       Follow up: Return in about 3 weeks (around 05/02/2023).    Bonner Puna, PA-C

## 2023-04-11 NOTE — Nursing Note (Signed)
04/11/23 1651   Domestic Violence   Because we are aware of abuse and domestic violence today, we ask all patients: Are you being hurt, hit, or frightened by anyone at your home or in your life?  N   Basic Needs   Do you have any basic needs within your home that are not being met? (such as Food, Shelter, [employer] fast [employer]er, Tranportation, paying for bills and/or medications) N

## 2023-04-11 NOTE — Assessment & Plan Note (Signed)
Will restart her HCTZ and have her monitor BP at home and keep log  Orders:    hydroCHLOROthiazide (HYDRODIURIL) 12.5 mg Oral Tablet; Take 1 Tablet (12.5 mg total) by mouth Once a day for 30 days

## 2023-05-03 ENCOUNTER — Other Ambulatory Visit: Payer: Self-pay

## 2023-05-03 ENCOUNTER — Encounter (INDEPENDENT_AMBULATORY_CARE_PROVIDER_SITE_OTHER): Payer: Self-pay | Admitting: PHYSICIAN ASSISTANT

## 2023-05-03 ENCOUNTER — Ambulatory Visit (INDEPENDENT_AMBULATORY_CARE_PROVIDER_SITE_OTHER): Payer: Medicare Other | Admitting: PHYSICIAN ASSISTANT

## 2023-05-03 VITALS — BP 136/75 | HR 76 | Temp 97.9°F | Ht 66.0 in | Wt 160.0 lb

## 2023-05-03 DIAGNOSIS — J310 Chronic rhinitis: Secondary | ICD-10-CM

## 2023-05-03 DIAGNOSIS — I10 Essential (primary) hypertension: Secondary | ICD-10-CM

## 2023-05-03 DIAGNOSIS — Z8616 Personal history of COVID-19: Secondary | ICD-10-CM

## 2023-05-03 MED ORDER — HYDROCHLOROTHIAZIDE 12.5 MG CAPSULE
12.5000 mg | ORAL_CAPSULE | Freq: Every day | ORAL | 0 refills | Status: DC
Start: 2023-05-03 — End: 2023-06-20

## 2023-05-03 NOTE — Progress Notes (Signed)
FAMILY MEDICINE, MEDICAL OFFICE BUILDING  [address]  [place] [address]      Progress Note    Tonya Francis  [birth year]  [id number]    Date of Service: 05/03/2023  5:00 PM EST    Chief complaint:   Chief Complaint   Patient presents with    Follow Up     3 wk       Subjective:     This is a case of a 68 y.o. year old female who comes in today for follow up. Pt.had covid 04/11/23 and she did not take the Paxlovid after speaking to the pharmacist. Pt.is sx free, and no sequela from the covid. Pt.feeling better and eating and drinking ok. Pts. BP is fluctuating. Pt.concerned over Singulair side effects and she feels like she has a lot of the side effects and wants to stop the Singulair. No headaches,  Taste returned to normal, energy level is good. Pt.tolerating HCTZ.    Current Outpatient Medications   Medication Sig    atenoloL (TENORMIN) 25 mg Oral Tablet Take 1 Tablet (25 mg total) by mouth Once a day    BIOTIN ORAL Take 6,000 mcg by mouth Once a day    cholecalciferol, vitamin D3, 25 mcg (1,000 unit) Oral Tablet Take 2 Tablets (2,000 Units total) by mouth Once a day    cyanocobalamin (VITAMIN B12) 1,000 mcg/mL Injection Solution Inject 1 mL (1,000 mcg total) into the muscle Every 14 days Every 2 wks    doxycycline hyclate (VIBRAMYCIN) 100 mg Oral Capsule Take 1 Capsule (100 mg total) by mouth Once a day    esomeprazole magnesium (NEXIUM) 40 mg Oral Capsule, Delayed Release(E.C.) Take 1 Capsule (40 mg total) by mouth Every morning before breakfast    fluticasone propionate (FLONASE) 50 mcg/actuation Nasal Spray, Suspension Administer 1 Spray into each nostril Twice per day as needed    hydroCHLOROthiazide (HYDRODIURIL) 12.5 mg Oral Tablet Take 1 Tablet (12.5 mg total) by mouth Once a day for 30 days    hydroCHLOROthiazide (MICROZIDE) 12.5 mg Oral Capsule Take 1 Capsule (12.5 mg total) by mouth Once a day Indications: high blood pressure    Ibuprofen (MOTRIN) 800 mg Oral Tablet Take 1 Tablet (800 mg  total) by mouth Twice per day as needed    levothyroxine (SYNTHROID) 75 mcg Oral Tablet Take 1 Tablet (75 mcg total) by mouth Once a day    loratadine (CLARITIN) 10 mg Oral Tablet Take 1 Tablet (10 mg total) by mouth Once a day OTC    multivit-min/folic ac/collagen (WOMEN'S MULTIVITAMIN COLLAGEN ORAL) Take 1 Scoop by mouth Once a day    mupirocin (BACTROBAN) 2 % Ointment Apply topically Twice daily    prenatal vitamin-iron-folate Tablet Take 1 Tablet by mouth Once a day    triamcinolone acetonide 0.1 % Ointment Apply topically Twice per day as needed for up to 90 days    UNKNOWN MEDICATION (UNKNOWN MEDICATION) Take 10 mg by mouth Once a day       Objective:     BP 136/75   Pulse 76   Temp 36.6 C (97.9 F) (Temporal)   Ht 1.676 m (5\' 6" )   Wt 72.6 kg (160 lb)   SpO2 99%   BMI 25.82 kg/m            Physical Exam  Vitals and nursing note reviewed.   Constitutional:       General: She is in acute distress.  Appearance: Normal appearance. She is normal weight.   HENT:      Head: Normocephalic and atraumatic.      Right Ear: Tympanic membrane normal.      Left Ear: Tympanic membrane normal.      Nose: Nose normal.      Mouth/Throat:      Mouth: Mucous membranes are moist.   Eyes:      Extraocular Movements: Extraocular movements intact.   Cardiovascular:      Rate and Rhythm: Normal rate and regular rhythm.      Pulses: Normal pulses.      Heart sounds: Normal heart sounds. No murmur heard.  Pulmonary:      Effort: Pulmonary effort is normal.      Breath sounds: Normal breath sounds.   Musculoskeletal:         General: Normal range of motion.      Cervical back: Normal range of motion and neck supple.      Right lower leg: No edema.      Left lower leg: No edema.   Lymphadenopathy:      Cervical: No cervical adenopathy.   Skin:     General: Skin is warm and dry.   Neurological:      General: No focal deficit present.      Mental Status: She is alert.   Psychiatric:         Mood and Affect: Mood normal.          Thought Content: Thought content normal.          Assessment & Plan  Hypertension, unspecified type    Orders:    BASIC METABOLIC PANEL; Future    MAGNESIUM; Future    Chronic rhinitis  Pt. Wishes to d/c Singulair due to concerns with side effects. Ok to d/c/singulair    Personal history of COVID-19  Pt.declines covid booster in future  Mtv daily and vitamin c          Other orders    hydroCHLOROthiazide (MICROZIDE) 12.5 mg Oral Capsule; Take 1 Capsule (12.5 mg total) by mouth Once a day Indications: high blood pressure          The patient was given the opportunity to ask questions and those questions were answered to the patient's satisfaction. The patient was encouraged to call with any additional questions or concerns.    Follow up: Return if symptoms worsen or fail to improve.    Dewayne Shorter, PA-C

## 2023-05-03 NOTE — Nursing Note (Signed)
05/03/23 1626   Domestic Violence   Because we are aware of abuse and domestic violence today, we ask all patients: Are you being hurt, hit, or frightened by anyone at your home or in your life?  N   Basic Needs   Do you have any basic needs within your home that are not being met? (such as Food, Shelter, [employer] fast [employer]er, Tranportation, paying for bills and/or medications) N

## 2023-05-05 ENCOUNTER — Encounter (INDEPENDENT_AMBULATORY_CARE_PROVIDER_SITE_OTHER): Payer: Self-pay | Admitting: PHYSICIAN ASSISTANT

## 2023-05-05 NOTE — Assessment & Plan Note (Signed)
 Orders:    BASIC METABOLIC PANEL; Future    MAGNESIUM; Future

## 2023-05-10 ENCOUNTER — Other Ambulatory Visit: Payer: Medicare Other | Attending: PHYSICIAN ASSISTANT | Admitting: PHYSICIAN ASSISTANT

## 2023-05-10 ENCOUNTER — Other Ambulatory Visit: Payer: Self-pay

## 2023-05-10 ENCOUNTER — Ambulatory Visit (INDEPENDENT_AMBULATORY_CARE_PROVIDER_SITE_OTHER): Payer: Medicare Other

## 2023-05-10 DIAGNOSIS — I10 Essential (primary) hypertension: Secondary | ICD-10-CM | POA: Insufficient documentation

## 2023-05-10 LAB — MAGNESIUM: MAGNESIUM: 1.7 mg/dL — ABNORMAL LOW (ref 1.9–2.7)

## 2023-05-10 LAB — BASIC METABOLIC PANEL
ANION GAP: 8 mmol/L (ref 4–13)
BUN/CREA RATIO: 18 (ref 6–22)
BUN: 15 mg/dL (ref 7–25)
CALCIUM: 9.8 mg/dL (ref 8.6–10.3)
CHLORIDE: 103 mmol/L (ref 98–107)
CO2 TOTAL: 30 mmol/L (ref 21–31)
CREATININE: 0.84 mg/dL (ref 0.60–1.30)
ESTIMATED GFR: 75 mL/min/{1.73_m2} (ref >59)
GLUCOSE: 104 mg/dL (ref 74–109)
OSMOLALITY, CALCULATED: 282 mosm/kg (ref 270–290)
POTASSIUM: 3.7 mmol/L (ref 3.5–5.1)
SODIUM: 141 mmol/L (ref 136–145)

## 2023-05-12 MED ORDER — MAGNESIUM 500 MG (AS MAGNESIUM OXIDE) TABLET
500.0000 mg | ORAL_TABLET | Freq: Every day | ORAL | 0 refills | Status: DC
Start: 2023-05-12 — End: 2024-02-07

## 2023-05-12 NOTE — Addendum Note (Signed)
Addended by: Marius Ditch on: 05/12/2023 02:38 PM     Modules accepted: Orders

## 2023-05-23 ENCOUNTER — Ambulatory Visit (INDEPENDENT_AMBULATORY_CARE_PROVIDER_SITE_OTHER): Payer: Medicare Other

## 2023-05-23 ENCOUNTER — Other Ambulatory Visit: Payer: Self-pay

## 2023-05-23 ENCOUNTER — Other Ambulatory Visit: Payer: Medicare Other | Attending: PHYSICIAN ASSISTANT | Admitting: PHYSICIAN ASSISTANT

## 2023-05-23 LAB — MAGNESIUM: MAGNESIUM: 2.2 mg/dL (ref 1.9–2.7)

## 2023-05-24 ENCOUNTER — Encounter (INDEPENDENT_AMBULATORY_CARE_PROVIDER_SITE_OTHER): Payer: Self-pay | Admitting: PHYSICIAN ASSISTANT

## 2023-06-12 ENCOUNTER — Other Ambulatory Visit: Payer: Self-pay

## 2023-06-12 ENCOUNTER — Other Ambulatory Visit (HOSPITAL_COMMUNITY): Payer: Self-pay

## 2023-06-12 ENCOUNTER — Ambulatory Visit
Admission: RE | Admit: 2023-06-12 | Discharge: 2023-06-12 | Disposition: A | Discharge status: Home / Self Care | Payer: Medicare Other | Source: Ambulatory Visit | Attending: OTOLARYNGOLOGY | Admitting: OTOLARYNGOLOGY

## 2023-06-12 DIAGNOSIS — K116 Mucocele of salivary gland: Secondary | ICD-10-CM | POA: Insufficient documentation

## 2023-06-13 DIAGNOSIS — K118 Other diseases of salivary glands: Secondary | ICD-10-CM

## 2023-06-14 ENCOUNTER — Encounter (INDEPENDENT_AMBULATORY_CARE_PROVIDER_SITE_OTHER): Payer: Self-pay | Admitting: PHYSICIAN ASSISTANT

## 2023-06-20 ENCOUNTER — Other Ambulatory Visit: Payer: Self-pay

## 2023-06-20 ENCOUNTER — Encounter (INDEPENDENT_AMBULATORY_CARE_PROVIDER_SITE_OTHER): Payer: Self-pay | Admitting: PHYSICIAN ASSISTANT

## 2023-06-20 ENCOUNTER — Ambulatory Visit (INDEPENDENT_AMBULATORY_CARE_PROVIDER_SITE_OTHER): Payer: Medicare Other | Admitting: PHYSICIAN ASSISTANT

## 2023-06-20 VITALS — BP 140/67 | HR 75 | Temp 97.8°F | Ht 66.0 in | Wt 162.4 lb

## 2023-06-20 DIAGNOSIS — I10 Essential (primary) hypertension: Secondary | ICD-10-CM

## 2023-06-20 DIAGNOSIS — J029 Acute pharyngitis, unspecified: Secondary | ICD-10-CM

## 2023-06-20 DIAGNOSIS — E039 Hypothyroidism, unspecified: Secondary | ICD-10-CM

## 2023-06-20 MED ORDER — ATENOLOL 25 MG TABLET
25.0000 mg | ORAL_TABLET | Freq: Every day | ORAL | 0 refills | Status: DC
Start: 2023-06-20 — End: 2023-07-21

## 2023-06-20 MED ORDER — FLUCONAZOLE 150 MG TABLET
150.0000 mg | ORAL_TABLET | ORAL | 0 refills | Status: AC
Start: 2023-06-20 — End: 2023-06-24

## 2023-06-20 MED ORDER — HYDROCHLOROTHIAZIDE 12.5 MG CAPSULE
12.5000 mg | ORAL_CAPSULE | Freq: Every day | ORAL | 0 refills | Status: DC
Start: 2023-06-20 — End: 2024-02-07

## 2023-06-20 MED ORDER — LEVOTHYROXINE 75 MCG TABLET
75.0000 ug | ORAL_TABLET | Freq: Every day | ORAL | 0 refills | Status: DC
Start: 2023-06-20 — End: 2023-12-21

## 2023-06-20 NOTE — Nursing Note (Signed)
 06/20/23 1652   Domestic Violence   Because we are aware of abuse and domestic violence today, we ask all patients: Are you being hurt, hit, or frightened by anyone at your home or in your life?  N   Basic Needs   Do you have any basic needs within your home that are not being met? (such as Food, Shelter, [employer] fast streamer, Tranportation, paying for bills and/or medications) N

## 2023-06-20 NOTE — Assessment & Plan Note (Signed)
 Continue HCTZ and atenolol.   Orders:    atenoloL (TENORMIN) 25 mg Oral Tablet; Take 1 Tablet (25 mg total) by mouth Once a day for 90 days    hydroCHLOROthiazide (MICROZIDE) 12.5 mg Oral Capsule; Take 1 Capsule (12.5 mg total) by mouth Once a day Indications: high blood pressure

## 2023-06-20 NOTE — Nursing Note (Signed)
 06/20/23 1653   PHQ 9 (follow up)   Little interest or pleasure in doing things. 0   Feeling down, depressed, or hopeless 0   Trouble falling or staying asleep, or sleeping too much. 2   Feeling tired or having little energy 0   Poor appetite or overeating 0   Feeling bad about yourself/ that you are a failure in the past 2 weeks? 0   Trouble concentrating on things in the past 2 weeks? 0   Moving/Speaking slowly or being fidgety or restless  in the past 2 weeks? 3   Thoughts that you would be better off DEAD, or of hurting yourself in some way. 0   If you checked off any problems, how difficult have these problems made it for you to do your work, take care of things at home, or get along with other people? Somewhat difficult   PHQ 9 Total 5   Interpretation of Total Score Mild depression

## 2023-06-20 NOTE — Assessment & Plan Note (Signed)
 Continue synthroid.   Orders:    levothyroxine (SYNTHROID) 75 mcg Oral Tablet; Take 1 Tablet (75 mcg total) by mouth Once a day for 90 days

## 2023-06-20 NOTE — Progress Notes (Signed)
 PRN MEDICAL OFFICE [facility]  FAMILY MEDICINE, MEDICAL OFFICE BUILDING  [address]  [place] [address]  [phone]   Tonya Francis  1955/04/28  [id number]    Date of Service: 06/20/2023  Chief complaint:   Chief Complaint   Patient presents with    Follow Up 3 Months     Subjective:   Tonya Francis is a 68 y.o. female and presents today for follow-up on chronic disease management.  She reports recent [facility] eval for sore throat. She was prescribed Augmentin. She has had 4 doses and is already feeling some better. Throat is less sore but feels a little flushed still. BP running ok at home. Denies chest pain, SOB, heart flutters, dizziness, swelling, nausea, vomiting, diarrhea, constipation. She is planning on R knee surgery with Dr. Leighton Parody, this has not been scheduled. She is concerned about her after care as she doesn't have any family to assist her. She brought some papers from the [place] for an aid assistance.     Past Medical History:   Diagnosis Date    Allergic rhinitis     Arrhythmia     B12 deficiency     Cancer (CMS HCC)     basil cell skin    Cardiac disease 07/26/2021    DDD (degenerative disc disease), lumbar     Depression     Disorder of thyroid     Dyslipidemia     Elevated LFTs     Esophageal reflux     Fluttering sensation of heart     Hearing loss     Hiatal hernia     History of acne rosacea     History of colonic polyps     History of kidney stones     History of prediabetes     HNP (herniated nucleus pulposus), lumbar     Hx of Cataract     Left    Hx of Osteoarthritis     Hx of renal calculi     Hypertension     Lung nodule     Palpitations     Pre-diabetes     Rosacea     Thyroid condition     Tremor     UTI (urinary tract infection) 07/26/2021    Vitamin D deficiency          Past Surgical History:   Procedure Laterality Date    ABDOMINAL SURGERY      BOWEL BLOCKAGE    COLONOSCOPY      2014, 2023    DENTAL SURGERY      HEMORROIDECTOMY      HX CATARACT REMOVAL       HX CHOLECYSTECTOMY      HX CYSTOSCOPY      BIOPSY OF UTERUS    HX EYE SURGERY      HX THYROID BIOPSY      FNA Bx Benign 2021    HX TONSILLECTOMY      HX TUBAL LIGATION      KNEE ARTHROSCOPY Right     KNEE SURGERY      laproscopic    ORTHOPEDIC SURGERY Right     BROKEN RIGHT ARM         Current Outpatient Medications   Medication Sig Dispense Refill    amoxicillin-pot clavulanate (AUGMENTIN) 875-125 mg Oral Tablet Take 1 Tablet by mouth Twice daily      atenoloL (TENORMIN) 25 mg Oral Tablet Take 1 Tablet (25 mg total) by mouth Once  a day for 90 days 90 Tablet 0    BIOTIN ORAL Take 6,000 mcg by mouth Once a day      cholecalciferol, vitamin D3, 25 mcg (1,000 unit) Oral Tablet Take 2 Tablets (2,000 Units total) by mouth Once a day      cyanocobalamin (VITAMIN B12) 1,000 mcg/mL Injection Solution Inject 1 mL (1,000 mcg total) into the muscle Every 14 days Every 2 wks 12 Each 0    esomeprazole magnesium (NEXIUM) 40 mg Oral Capsule, Delayed Release(E.C.) Take 1 Capsule (40 mg total) by mouth Every morning before breakfast      fluconazole (DIFLUCAN) 150 mg Oral Tablet Take 1 Tablet (150 mg total) by mouth Every 72 hours for 2 doses 2 Tablet 0    fluticasone propionate (FLONASE) 50 mcg/actuation Nasal Spray, Suspension Administer 1 Spray into each nostril Twice per day as needed 3 g 1    hydroCHLOROthiazide (HYDRODIURIL) 12.5 mg Oral Tablet Take 1 Tablet (12.5 mg total) by mouth Once a day for 30 days 30 Tablet 0    hydroCHLOROthiazide (MICROZIDE) 12.5 mg Oral Capsule Take 1 Capsule (12.5 mg total) by mouth Once a day Indications: high blood pressure 90 Capsule 0    Ibuprofen (MOTRIN) 800 mg Oral Tablet Take 1 Tablet (800 mg total) by mouth Twice per day as needed      levothyroxine (SYNTHROID) 75 mcg Oral Tablet Take 1 Tablet (75 mcg total) by mouth Once a day for 90 days 90 Tablet 0    loratadine (CLARITIN) 10 mg Oral Tablet Take 1 Tablet (10 mg total) by mouth Once a day OTC      magnesium Oxide 500 mg magnesium Oral  Tablet Take 1 Tablet (500 mg total) by mouth Once a day 90 Tablet 0    multivit-min/folic ac/collagen (WOMEN'S MULTIVITAMIN COLLAGEN ORAL) Take 1 Scoop by mouth Once a day      mupirocin (BACTROBAN) 2 % Ointment Apply topically Twice daily 22 g 1    prenatal vitamin-iron-folate Tablet Take 1 Tablet by mouth Once a day      UNKNOWN MEDICATION (UNKNOWN MEDICATION) Take 10 mg by mouth Once a day       No current facility-administered medications for this visit.     Allergies   Allergen Reactions    Latex Itching    Sulfa (Sulfonamides) Swelling and Angioedema    Latex Rash     Review of Systems:  Any pertinent Review of Systems as addressed in the HPI above.    Objective:   BP (!) 140/67   Pulse 75   Temp 36.6 C (97.8 F) (Temporal)   Ht 1.676 m (5\' 6" )   Wt 73.7 kg (162 lb 6.4 oz)   SpO2 98%   BMI 26.21 kg/m       Constitutional: No acute distress. Alert and oriented.  Head: Normocephalic, atraumatic.  Eyes: Clear. PERRL. EOMI.  Ears: Canals and TMs clear and intact bilaterally.  Nose: Patent. No rhinorrhea.  Throat: mild erythema  Neck: Supple. No lymphadenopathy. No bruit.   Lungs: Clear to auscultation bilaterally. No wheezes, rhonchi, or rales.  Heart: Regular rate and rhythm. No murmurs.  Abdomen: Soft, non-tender, nondistended. BS active.  Extremities: Intact x 4. No edema. Gait stable.  Neuro: CN 2-12 grossly intact.   Psych: Mood and affect appropriate and congruent.    Assessment & Plan  Hypertension, unspecified type  Continue HCTZ and atenolol.   Orders:    atenoloL (TENORMIN) 25 mg Oral Tablet; Take  1 Tablet (25 mg total) by mouth Once a day for 90 days    hydroCHLOROthiazide (MICROZIDE) 12.5 mg Oral Capsule; Take 1 Capsule (12.5 mg total) by mouth Once a day Indications: high blood pressure    Hypothyroidism, unspecified type  Continue synthroid.   Orders:    levothyroxine (SYNTHROID) 75 mcg Oral Tablet; Take 1 Tablet (75 mcg total) by mouth Once a day for 90 days    Pharyngitis, unspecified  etiology  Continue the Augmentin. I have written a rx for Diflucan in case of yeast infection.        Other orders    fluconazole (DIFLUCAN) 150 mg Oral Tablet; Take 1 Tablet (150 mg total) by mouth Every 72 hours for 2 doses      Counseling with regards to preventative care given. Medication summary was discussed with the patient to verify compliance and understanding. Medication safety was discussed. A good faith effort was made to reconcile the patient's medications.     The patient was given the opportunity to ask questions and those questions were answered to the patient's satisfaction. The patient was encouraged to call with any additional questions or concerns. More than 50% of the visit was spent counseling and coordinating care.    I will discuss her paperwork and concerns regarding aid assistance through the [place] with her PCP Kassie.    Orders Placed This Encounter    fluconazole (DIFLUCAN) 150 mg Oral Tablet    atenoloL (TENORMIN) 25 mg Oral Tablet    hydroCHLOROthiazide (MICROZIDE) 12.5 mg Oral Capsule    levothyroxine (SYNTHROID) 75 mcg Oral Tablet                  Return in about 4 weeks (around 07/18/2023).  [address], PA-C 06/20/2023, 17:33

## 2023-06-22 ENCOUNTER — Encounter (INDEPENDENT_AMBULATORY_CARE_PROVIDER_SITE_OTHER): Payer: Self-pay | Admitting: OTOLARYNGOLOGY

## 2023-06-22 ENCOUNTER — Other Ambulatory Visit: Payer: Self-pay

## 2023-06-22 ENCOUNTER — Ambulatory Visit: Payer: Medicare Other | Attending: OTOLARYNGOLOGY | Admitting: OTOLARYNGOLOGY

## 2023-06-22 ENCOUNTER — Other Ambulatory Visit (INDEPENDENT_AMBULATORY_CARE_PROVIDER_SITE_OTHER): Payer: Self-pay | Admitting: OTOLARYNGOLOGY

## 2023-06-22 VITALS — Ht 66.0 in | Wt 162.0 lb

## 2023-06-22 DIAGNOSIS — J342 Deviated nasal septum: Secondary | ICD-10-CM | POA: Insufficient documentation

## 2023-06-22 DIAGNOSIS — E041 Nontoxic single thyroid nodule: Secondary | ICD-10-CM

## 2023-06-22 DIAGNOSIS — J309 Allergic rhinitis, unspecified: Secondary | ICD-10-CM

## 2023-06-22 DIAGNOSIS — H9312 Tinnitus, left ear: Secondary | ICD-10-CM | POA: Insufficient documentation

## 2023-06-22 DIAGNOSIS — K116 Mucocele of salivary gland: Secondary | ICD-10-CM

## 2023-06-22 NOTE — H&P (Signed)
 ENT, [facility]  [address]  [place] [address]  Operated by [facility]  Progress Note    Name: Tonya Francis MRN:  [id number]   Date: 06/22/2023 DOB:  06-04-1955 (68 y.o.)              Follow Up      Subjective:   Chief Complaint:   Results (Rc after US neck. Also treated this week for strep throat, feeling better. )       History of Present Illness:  Tonya Francis is a 68 y.o. old female female who presents to the clinic for follow-up after US of the neck results are below.        PACS Images     Show images for US SOFT TISSUE NECK    Imaging Services, [facility]   [address]  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  Patient had brain MR at [facility] on 11/23/2022. That study shows a homogeneously T2 hyperintense sharply defined 5 mm lesion in the right parotid.  Hx of rt parotid cyst.     TECHNIQUE: Ultrasound targeted to the right parotid.. Comparative views of the left carotid were obtained.     COMPARISON: None. Brain MR 11/23/2022.     FINDINGS:     The right parotid exhibits an upper gland near anechoic sharply defined cystic lesion with enhanced through transmission. It measures about 8.5 x 6.7 x 6.3 mm. It is avascular on Doppler. No other significant parotid lesion is seen on either side. No periparotid lesions are seen.        IMPRESSION:     SIMPLE APPEARING CYSTIC LESION IN THE RIGHT PAROTID AS DESCRIBED  ABOVE. SONOGRAPHICALLY, THIS APPEARS SIMPLE CYSTIC, WITH ENHANCED THROUGH TRANSMISSION. IT MEASURES ABOUT 8.5 X 6.7 X 6.3 MM, SO IS SLIGHTLY LARGER THAN ON THE PREVIOUS MR. WHICH MEASURED ABOUT 5 MM.                       Radiologist location ID: [id number]           Radiologist location ID: [id number]         Review of Systems     Physical Exam:     Vitals:    06/22/23 0933   Weight: 73.5 kg (162 lb)   Height: 1.676 m (5\' 6" )   BMI: 26.15      ENT Physical Exam  Constitutional  Appearance: patient appears well-developed, well-nourished and well-groomed,  Communication/Voice: communication appropriate for developmental age; vocal quality normal;  Head and Face  Appearance: head appears normal, face appears normal and face appears atraumatic;  Palpation: facial palpation normal;  Salivary: glands normal;  Ear  Hearing: intact;  Auricles: right auricle normal; left auricle normal;  External Mastoids: right external mastoid normal; left external mastoid normal;  Ear Canals: right ear canal normal; left ear canal normal;  Tympanic Membranes: right tympanic membrane normal; left tympanic membrane normal;  Nose  External Nose: nares patent bilaterally; external nose normal;  Internal Nose: nasal mucosa normal; nasal septal deviation present; bilateral inferior turbinates with hypertrophy;  Oral Cavity/Oropharynx  Lips: normal;  Teeth: normal;  Gums: gingiva normal;  Tongue: normal;  Oral mucosa: normal;  Hard palate: normal;  Neck  Neck: neck normal; neck palpation normal;  Thyroid: thyroid normal;  Respiratory  Inspection: breathing unlabored; normal breathing rate;  Lymphatic  Palpation: lymph nodes normal;  Neurovestibular  Mental Status: alert and oriented;  Psychiatric: mood normal; affect is appropriate;  Cranial Nerves: cranial nerves intact;       Assessment and Plan:       ICD-10-CM    1. Right thyroid nodule  E04.1       2. Allergic rhinitis, unspecified seasonality, unspecified trigger  J30.9       3. Nasal  septal deviation  J34.2       4. 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  Finish abx for strep.    Continue Claritin, Flonase     Follow up:  Return for Follow up after US of the thyroid.    Conchita Paris, DO

## 2023-06-23 ENCOUNTER — Telehealth (INDEPENDENT_AMBULATORY_CARE_PROVIDER_SITE_OTHER): Payer: Self-pay | Admitting: PHYSICIAN ASSISTANT

## 2023-06-23 NOTE — Telephone Encounter (Signed)
 Spoke with pt. She stated she understanding, and thanks you and Kassie for looking at the papers.

## 2023-06-23 NOTE — Telephone Encounter (Signed)
-----   Message from Bonner Puna, [place] sent at 06/23/2023  2:31 PM EST -----  Can you let the patient know I looked at the forms she brought and Shary Key also looked at them and I don't think these are the forms we need. These papers are for a permanent need for aid and assistance and require documentation of "permanent and totally disabling conditions" so I'm not sure this would be what we would complete for an aid to assist her temporarily post operatively. Maybe she can [facility] with the [facility] and see if theres something else we need to complete.

## 2023-06-23 NOTE — Telephone Encounter (Signed)
 Spoke with pt, pt. Stated there is no other papers, that is the only paperwork the [facility] has to offer her. She is concern because she has no family, and she is not going to be able to drive for two weeks after the surgery.

## 2023-07-08 ENCOUNTER — Other Ambulatory Visit (INDEPENDENT_AMBULATORY_CARE_PROVIDER_SITE_OTHER): Payer: Self-pay | Admitting: PHYSICIAN ASSISTANT

## 2023-07-21 ENCOUNTER — Other Ambulatory Visit: Payer: Self-pay

## 2023-07-21 ENCOUNTER — Encounter (INDEPENDENT_AMBULATORY_CARE_PROVIDER_SITE_OTHER): Payer: Self-pay | Admitting: PHYSICIAN ASSISTANT

## 2023-07-21 ENCOUNTER — Ambulatory Visit (INDEPENDENT_AMBULATORY_CARE_PROVIDER_SITE_OTHER): Payer: Self-pay | Admitting: PHYSICIAN ASSISTANT

## 2023-07-21 VITALS — BP 140/67 | HR 64 | Temp 98.4°F | Ht 66.0 in | Wt 163.0 lb

## 2023-07-21 DIAGNOSIS — Z1211 Encounter for screening for malignant neoplasm of colon: Secondary | ICD-10-CM

## 2023-07-21 DIAGNOSIS — R202 Paresthesia of skin: Secondary | ICD-10-CM

## 2023-07-21 DIAGNOSIS — R790 Abnormal level of blood mineral: Secondary | ICD-10-CM

## 2023-07-21 DIAGNOSIS — E785 Hyperlipidemia, unspecified: Secondary | ICD-10-CM

## 2023-07-21 DIAGNOSIS — M1990 Unspecified osteoarthritis, unspecified site: Secondary | ICD-10-CM

## 2023-07-21 DIAGNOSIS — M2550 Pain in unspecified joint: Secondary | ICD-10-CM

## 2023-07-21 DIAGNOSIS — M542 Cervicalgia: Secondary | ICD-10-CM

## 2023-07-21 DIAGNOSIS — Z1231 Encounter for screening mammogram for malignant neoplasm of breast: Secondary | ICD-10-CM

## 2023-07-21 DIAGNOSIS — I10 Essential (primary) hypertension: Secondary | ICD-10-CM

## 2023-07-21 DIAGNOSIS — Z0000 Encounter for general adult medical examination without abnormal findings: Secondary | ICD-10-CM

## 2023-07-21 DIAGNOSIS — E039 Hypothyroidism, unspecified: Secondary | ICD-10-CM

## 2023-07-21 DIAGNOSIS — M353 Polymyalgia rheumatica: Secondary | ICD-10-CM

## 2023-07-21 DIAGNOSIS — G903 Multi-system degeneration of the autonomic nervous system: Secondary | ICD-10-CM

## 2023-07-21 DIAGNOSIS — E538 Deficiency of other specified B group vitamins: Secondary | ICD-10-CM

## 2023-07-21 MED ORDER — ESOMEPRAZOLE MAGNESIUM 40 MG CAPSULE,DELAYED RELEASE
40.0000 mg | DELAYED_RELEASE_CAPSULE | Freq: Every morning | ORAL | 1 refills | Status: DC
Start: 2023-07-21 — End: 2023-12-21

## 2023-07-21 MED ORDER — IBUPROFEN 800 MG TABLET
800.0000 mg | ORAL_TABLET | Freq: Two times a day (BID) | ORAL | 0 refills | Status: DC | PRN
Start: 2023-07-21 — End: 2024-02-07

## 2023-07-21 MED ORDER — ATENOLOL 25 MG TABLET
25.0000 mg | ORAL_TABLET | Freq: Every day | ORAL | 0 refills | Status: DC
Start: 2023-07-21 — End: 2024-01-03

## 2023-07-21 NOTE — Nursing Note (Signed)
 07/21/23 1638   Medicare Wellness Assessment   Medicare initial or wellness physical in the last year? Yes   Advance Directives   Does patient have a living will or MPOA Yes   Has patient provided [employer] with a copy? Yes   Activities of Daily Living   Do you need help with dressing, bathing, or walking? No   Do you need help with shopping, housekeeping, medications, or finances? No   Do you have rugs in hallways, broken steps, or poor lighting? Yes   Do you have grab bars in your bathroom, non-slip strips in your tub, and hand rails on your stairs? Yes   Cognitive Function Screen   What is you age? 1   What is the time to the nearest hour? 1   What is the year? 1   What is the name of this clinic? 0   Can the patient recognize two persons (the doctor, the nurse, home help, etc.)? 1   What is the date of your birth? (day and month sufficient)  1   In what year did World War II end? 0   Who is the current president of the [place] ? 1   Count from 20 down to 1? 1   What address did I give you earlier? 1   Total Score 8   Interpretation of Total Score Greater than 6 Normal   Depression Screen   Little interest or pleasure in doing things. 0   Feeling down, depressed, or hopeless 3   PHQ 2 Total 3   Trouble falling or staying asleep, or sleeping too much. 3   Feeling tired or having little energy 1   Poor appetite or overeating 2   Feeling bad about yourself/ that you are a failure in the past 2 weeks? 0   Trouble concentrating on things in the past 2 weeks? 3   Moving/Speaking slowly or being fidgety or restless  in the past 2 weeks? 0   Thoughts that you would be better off DEAD, or of hurting yourself in some way. 0   If you checked off any problems, how difficult have these problems made it for you to do your work, take care of things at home, or get along with other people? Somewhat difficult   PHQ 9 Total 12   Interpretation of Total Score Moderate depression   Pain Score   Pain Score EIGHT    Substance Use Screening   In Past 12 MONTHS, how often have you used any tobacco product (for example, cigarettes, e-cigarettes, cigars, pipes, or smokeless tobacco)? Never   In the PAST 12 MONTHS, how often have you had 5 (men)/4 (women) or more drinks containing alcohol in one day? Never   In the PAST 12 months, how often have you used any prescription medications just for the feeling, more than prescribed, or that were not prescribed for you? Prescriptions may include: opioids, benzodiazepines, medications for ADHD Never   In the PAST 12 MONTHS, how often have you used any drugs, including marijuana, cocaine or crack, heroin, methamphetamine, hallucinogens, ecstasy/MDMA? Never   Hearing Screen   Have you noticed any hearing difficulties? Yes   After whispering 9-1-6 how many numbers did the patient repeat correctly? 3   Fall Risk Assessment   Do you feel unsteady when standing or walking? Yes   Do you worry about falling? No   Have you fallen in the past year? No   Urinary Incontinence Screen  Do you ever leak urine when you don't want to? YES   OTHER   Reported to Encounter Provider Yes

## 2023-07-21 NOTE — Progress Notes (Addendum)
 FAMILY MEDICINE, MEDICAL OFFICE BUILDING  [address]  [place] [address]      Progress Note    Tonya Francis  1956-04-16  [id number]    Date of Service: 07/21/2023  4:45 PM EDT    Chief complaint:   Chief Complaint   Patient presents with    Follow Up     Per pt she will be having partial knee sx soon        Subjective:     This is a case of a 68 y.o. year old female who comes in today for follow up and med refills. Pt.notices a little of congestion. Pt.is scheduled partial bilateral knee surgery soon with D.Azzo. Pt.requests we complete form for her to obtain temporary help/assitance post-op due to she lives alone and has no extended family or support system. Pt.states she's been having numbness in hands for awhile, usually at night. Pt.states no change in grip or strength. No other sx with the hand numbness, she has been using a new lotion on her face and she questions if that's the problem.       Review of Systems   Constitutional:  Negative for chills, fatigue and fever. Activity change: hydroclacid.acid 3/25.  HENT:  Positive for congestion and postnasal drip. Negative for sinus pressure and sinus pain.    Eyes:  Photophobia: colonoscopy dr.duremedes re heck 7 years.   Respiratory: Negative.  Negative for cough, chest tightness, shortness of breath and wheezing.    Cardiovascular:  Negative for chest pain, palpitations and leg swelling.   Gastrointestinal:  Negative for abdominal pain, constipation, diarrhea, nausea and vomiting.   Musculoskeletal:  Positive for arthralgias, back pain, joint swelling, myalgias, neck pain and neck stiffness.   Skin: Negative.    Neurological:  Positive for dizziness, tremors and numbness.   Psychiatric/Behavioral:  Positive for sleep disturbance (hip and back pain).         Current Outpatient Medications   Medication Sig    atenoloL  (TENORMIN ) 25 mg Oral Tablet Take 1 Tablet (25 mg total) by mouth Daily for 90 days    BIOTIN ORAL Take 6,000 mcg by mouth Once a day     cholecalciferol, vitamin D3, 25 mcg (1,000 unit) Oral Tablet Take 2 Tablets (2,000 Units total) by mouth Daily    cyanocobalamin  (VITAMIN B12) 1,000 mcg/mL Injection Solution Inject 1 mL (1,000 mcg total) into the muscle Every 14 days Every 2 wks    esomeprazole  magnesium  (NEXIUM ) 40 mg Oral Capsule, Delayed Release(E.C.) Take 1 Capsule (40 mg total) by mouth Every morning before breakfast    fluticasone  propionate (FLONASE ) 50 mcg/actuation Nasal Spray, Suspension INSTILL 1 SPRAY INTO NOSTRIL(S) TWICE DAILY AS NEEDED    hydroCHLOROthiazide  (HYDRODIURIL ) 12.5 mg Oral Tablet Take 1 Tablet (12.5 mg total) by mouth Once a day for 30 days    hydroCHLOROthiazide  (MICROZIDE ) 12.5 mg Oral Capsule Take 1 Capsule (12.5 mg total) by mouth Once a day Indications: high blood pressure    Ibuprofen  (MOTRIN ) 800 mg Oral Tablet Take 1 Tablet (800 mg total) by mouth Twice per day as needed    levothyroxine  (SYNTHROID) 75 mcg Oral Tablet Take 1 Tablet (75 mcg total) by mouth Once a day for 90 days    loratadine (CLARITIN) 10 mg Oral Tablet Take 1 Tablet (10 mg total) by mouth Daily OTC    magnesium  Oxide 500 mg magnesium  Oral Tablet Take 1 Tablet (500 mg total) by mouth Once a day (Patient taking differently: Take  400 mg by mouth Daily)    multivit-min/folic ac/collagen (WOMEN'S MULTIVITAMIN COLLAGEN ORAL) Take 1 Scoop by mouth Once a day    mupirocin  (BACTROBAN ) 2 % Ointment Apply topically Twice daily    prenatal vitamin-iron-folate Tablet Take 1 Tablet by mouth Daily    UNKNOWN MEDICATION (UNKNOWN MEDICATION) Take 10 mg by mouth Once a day       Objective:     BP (!) 140/67   Pulse 64   Temp 36.9 C (98.4 F) (Temporal)   Ht 1.676 m (5\' 6" )   Wt 73.9 kg (163 lb)   SpO2 96%   BMI 26.31 kg/m     Physical Exam  Vitals and nursing note reviewed.   Constitutional:       General: She is not in acute distress.     Appearance: Normal appearance. She is normal weight.   HENT:      Head: Normocephalic and atraumatic.      Right  Ear: Tympanic membrane normal.      Left Ear: Tympanic membrane normal.      Nose: Nose normal.   Eyes:      Extraocular Movements: Extraocular movements intact.      Conjunctiva/sclera: Conjunctivae normal.   Neck:      Vascular: No carotid bruit.      Comments: Pt.has thyromegaly and follows with Dr.Weitzel  Cardiovascular:      Rate and Rhythm: Normal rate and regular rhythm.      Pulses: Normal pulses.      Heart sounds: Normal heart sounds.   Pulmonary:      Effort: Pulmonary effort is normal.      Breath sounds: Normal breath sounds.   Abdominal:      General: Abdomen is flat. Bowel sounds are normal.      Palpations: Abdomen is soft.   Musculoskeletal:         General: No tenderness or signs of injury.      Cervical back: Normal range of motion.      Comments: Hand ROM WNL bilaterally, negative Tinels and negative phalens, grip intact no joint edema noted,and no skin changes seen.   Lymphadenopathy:      Cervical: No cervical adenopathy.   Skin:     General: Skin is warm and dry.   Neurological:      General: No focal deficit present.      Mental Status: She is alert. Mental status is at baseline.   Psychiatric:         Mood and Affect: Mood normal.         Behavior: Behavior normal.        Assessment & Plan  Hypertension, unspecified type  Continue meds as directed  Consider adjusting meds if BP continues to be elevated  Orders:    BASIC METABOLIC PANEL; Future    CBC/DIFF; Future    Paresthesia of both hands  Discuss with patient and she wishes to stop her new lotion and see if the numbness sensation in hands resolve.  Pt.agrees if sx continue we will order NCS/EMG.  If paresthesia radiates or worsens RTC        Hypothyroidism  Check labs  Continue meds as directed  Orders:    THYROID  STIMULATING HORMONE (SENSITIVE TSH); Future    Hyperlipidemia    Orders:    LIPID PANEL; Future    HEPATIC FUNCTION PANEL; Future    Polyarthralgia    Orders:    RHEUMATOID FACTOR, SERUM; Future  HEP-2 SUBSTRATE ANTINUCLEAR  ANTIBODIES (ANA), SERUM; Future    SEDIMENTATION RATE; Future    Referral to External Provider    Breast cancer screening by mammogram    Orders:    MAMMO BILATERAL SCREENING-ADDL VIEWS/BREAST US  AS REQ BY RAD; Future    Vitamin B12 deficiency  Check vit B12 level and determine dose for tx         Low magnesium  level    Orders:    MAGNESIUM ; Future    VITAMIN B12; Future    Neck pain    Orders:    Referral to External Provider    Arthritis    Orders:    Referral to External Provider    Polymyalgia (CMS [facility])    Orders:    Referral to External Provider       Other orders    esomeprazole  magnesium  (NEXIUM ) 40 mg Oral Capsule, Delayed Release(E.C.); Take 1 Capsule (40 mg total) by mouth Every morning before breakfast    Ibuprofen  (MOTRIN ) 800 mg Oral Tablet; Take 1 Tablet (800 mg total) by mouth Twice per day as needed    atenoloL  (TENORMIN ) 25 mg Oral Tablet; Take 1 Tablet (25 mg total) by mouth Daily for 90 days          The patient was given the opportunity to ask questions and those questions were answered to the patient's satisfaction. The patient was encouraged to call with any additional questions or concerns.    Follow up: Return in about 1 month (around 08/21/2023).    [place], PA-C

## 2023-07-21 NOTE — Assessment & Plan Note (Addendum)
 Check labs  Continue meds as directed  Orders:    THYROID  STIMULATING HORMONE (SENSITIVE TSH); Future

## 2023-07-21 NOTE — Patient Instructions (Signed)
 Medicare Preventive Services  Medicare coverage information Recommendation for YOU   Heart Disease and Diabetes   Lipid profile Every 5 years or more often if at risk for cardiovascular disease     Lab Results   Component Value Date    CHOLESTEROL 198 10/31/2022    HDLCHOL 47 10/31/2022    LDLCHOL 98 10/31/2022    TRIG 264 (H) 10/31/2022           Diabetes Screening    Yearly for those at risk for diabetes, 2 tests per year for those with prediabetes Last Glucose: 104    Diabetes Self Management Training or Medical Nutrition Therapy  For those with diabetes, up to 10 hrs initial training within a year, subsequent years up to 2 hrs of follow up training Optional for those with diabetes     Medical Nutrition Therapy  Three hours of one-on-one counseling in first year, two hours in subsequent years Optional for those with diabetes, kidney disease   Intensive Behavioral Therapy for Obesity  Face-to-face counseling, first month every week, month 2-6 every other week, month 7-12 every month if continued progress is documented Optional for those with Body Mass Index 30 or higher  Your There is no height or weight on file to calculate BMI.   Tobacco Cessation (Quitting) Counseling   Covers up to 8 smoking and tobacco-use cessation counseling sessions in a 69-month period.    Optional for those that use tobacco   Cancer Screening Last Completion Date   Colorectal screening   For anyone age 57 to 98 or any age if high risk:  Screening Colonoscopy every 10 yrs if low risk,  more frequent if higher risk  OR  Cologuard Stool DNA test once every 3 years OR  Fecal Occult Blood Testing yearly OR  Flexible  Sigmoidoscopy  every 5 yr OR  CT Colonography every 5 yrs    --08/11/2021  See below for due date if applicable.   Screening Pap Test   Recommended every 3 years for all women age 36 to 15, or every five years if combined with HPV test (routine screening not needed after total hysterectomy).  Medicare covers every 2 years or  yearly if high risk.  Screening Pelvic Exam   Medicare covers every 2 years, yearly if high risk or childbearing age with abnormal Pap in last 3 yrs.     See below for due date if applicable.   Screening Mammogram   Recommended every 2 years for women age 34 to 2, or more frequent if you have a higher risk. Selectively recommended for women between 40-49 based on shared decisions about risk. Covered by Medicare up to every year for women age 39 or older --12/15/2022  See below for due date if applicable.         Lung Cancer Screening  Annual low dose computed tomography (LDCT scan) is recommended for those age 32-80 who smoked 20 pack-years and are current smokers or quit smoking within past 15 years, after counseling by your doctor or nurse clinician about the possible benefits or harms.     See below for due date if applicable.   Vaccinations   Respiratory syncytial virus (RSV)  Age 22 years or older: Based on shared clinical decision-making with your provider.  Pneumococcal Vaccine  Recommended routinely age 64+ with one or two separate vaccines based on your risk. Recommended before age 68 if medical conditions with increased risk  Seasonal Influenza Vaccine  Once  every flu season   Hepatitis B Vaccine  3 doses if risk (including anyone with diabetes or liver disease)  Shingles Vaccine  Two doses at age 89 or older  Diphtheria Tetanus Pertussis Vaccine  ONCE as adult, booster every 10 years     Immunization History   Administered Date(s) Administered   . Covid-19 Vaccine,Pfizer-BioNTech,Purple Top,19yrs+ 06/29/2019, 07/19/2019, 04/01/2020   . Flu Family 06/29/1998, 02/26/2001, 01/25/2002, 03/08/2004, 01/24/2006, 03/05/2007, 01/02/2008, 01/27/2010, 05/04/2011   . Flucelvax Influenza Vaccine, 6 months + 04/09/2015   . High-Dose Influenza Vaccine, 65+ 03/04/2014, 02/25/2021   . Influenza Vaccine, 6 month-adult 03/04/2014, 04/09/2015   . Pneumococcal, Unspecified Formulation 06/29/1998, 10/08/2014   . Pneumovax  10/08/2014   . Shingrix - Zoster Vaccine 01/25/2021, 05/05/2021   . Td Family 06/29/1998, 10/06/2009   . Tetanus Toxoid/Diphtheria Toxoid/Acellular Pertussis Vaccine, Adsorbed 12/21/2020   . Tetanus,Diptheria,Pertussis(BOOSTRIX) 12/21/2020   . ZOSTAVAX (VARICELLA ZOSTER VACCINE) 02/04/2014     Shingles vaccine and Diphtheria Tetanus Pertussis vaccines are available at pharmacies or local health department without a prescription.   Other Preventative Screening  Last Completion Date   Bone Densitometry   Screening: All females ages 55 and older every 10 years if initial screening normal. Postmenopausal women ages 54-64 need screening with one or more risk factor: previous fracture, parental hip fracture, current smoker, low body weight, excessive alcohol use, Rheumatoid Arthritis   For women with diagnosed Osteoporosis, follow up is recommended every 2 years or a frequency recommended by your provider.     --12/15/2022  See below for due date if applicable.     Glaucoma Screening   Yearly if in high risk group such as diabetes, family history, African American age 63+ or Hispanic American age 75+   See your eye care provider for screening.   Hepatitis C Screening   Recommended  for those born between ages 18-79 years.     See below for due date if applicable.     HIV Testing  Recommended routinely at least ONCE, covered every year for age 99 to 74 regardless of risk, and every year for age over 87 who ask for the test or higher risk. Yearly or up to 3 times in pregnancy         See below for due date if applicable.   Abdominal Aortic Aneurysm Screening Ultrasound   Once with a family history of abdominal aortic aneurysms OR a female between65-75 and have smoked at least 100 cigarettes in your lifetime.         See below for due date if applicable.       Your Personalized Schedule for Preventive Tests   Health Maintenance: Pending and Last Completed       Date Due Completion Date    Hepatitis C screening Never done ---     RSV Adult 60+ or Pregnancy (1 - Risk 60-74 years 1-dose series) Never done ---    Pneumococcal Vaccination, Age 87+ (2 of 2 - PCV) 10/08/2015 10/08/2014    Covid-19 Vaccine (4 - 2024-25 season) 12/25/2022 04/01/2020    Breast Cancer Screening 12/14/2024 12/15/2022    Osteoporosis screening 12/15/2027 12/15/2022    Adult Tdap-Td (2 - Td or Tdap) 12/22/2030 12/21/2020    Colonoscopy 08/12/2031 08/11/2021                For Information on Advanced Directives for Health Care:  [place]:  [email or web address]  [place], [place], [place], [place] General Information: [email or web address]

## 2023-07-21 NOTE — Progress Notes (Signed)
 FAMILY MEDICINE, MEDICAL OFFICE BUILDING  [address]  [place] [address]    Medicare Annual Wellness Visit    Name: Tonya Francis  MRN:  [id number]  Date: 07/21/2023  Age: 68 y.o.      SUBJECTIVE:   Tonya Francis is a 68 y.o. female for presenting for Medicare Wellness exam.   I have reviewed and reconciled the medication list with the patient today.        07/21/2023     4:32 PM   Comprehensive Health Assessment-Adult   Do you wish to complete this form? Yes   During the past 4 weeks, how would you rate your health in general? Good   During the past 4 weeks, how much difficulty have you had doing your usual activities inside and outside your home because of medical or emotional problems? A little bit of difficulty   During the past 4 weeks, was someone available to help you if you needed and wanted help? No   In the past year, how many times have you gone to the emergency department or been admitted to a hospital for a health problem? None   Are you generally satisfied with your sleep? No   Do you have enough money to buy things you need in everyday life, such as food, clothing, medicines, and housing? Yes, always   Can you get to places beyond walking distance without help?  (For example, can you drive your own car or travel alone on buses)? Yes   Do you fasten your seatbelt when you are in a car? Yes, usually   Do you exercise 20 minutes 3 or more days per week (such as walking, dancing, biking, mowing grass, swimming)? Yes, most of the time   How often do you eat food that is healthy (fruits, vegetables, lean meats) instead of unhealthy (sweets, fast food, junk food, fatty foods)? Some of the time   Have your parents, brothers or sisters had any of the following problems before the age of 25? (check all that apply) Heart problems, or hardening of the arteries;High cholesterol;Mental health problems such as depression, bipolar, severe anxiety, postpartum depression;Cancer   How often do you have  trouble taking medicines the eay you are told to take them? I always take them as prescribed   Do you need any help communicating with your doctors and nurses because of vision or hearing problems? Yes   During the past 12 months, have you experienced confusion or memory loss that is happening more often or is getting worse? Possibly, I am not sure   Do you have one person you think of as your personal doctor (primary care provider or family doctor)? Yes   If you are seeing a Primary Care Provider (PCP) or family doctor. please list their name Mills Alma   Are you now also seeing any specialist physician(s) (such as eye doctor, foot doctor, skin doctor)? Yes   If you are seeing a specialist for anything such as foot, eye, skin, etc.  please list their name(s) Eye doctor, Ortho, Cardio   How confident are you that you can control or manage most of your health problems? Somewhat confident     I have reviewed and updated as appropriate the past medical, family and social history. 07/21/2023 as summarized below:    Past Surgical History:   Procedure Laterality Date    Abdominal surgery      Colonoscopy      Dental surgery  Hemorroidectomy      Hx cataract removal      Hx cholecystectomy      Hx cystoscopy      Hx eye surgery      Hx thyroid  biopsy      Hx tonsillectomy      Hx tubal ligation      Knee arthroscopy Right     Knee surgery      Orthopedic surgery Right      Current Outpatient Medications   Medication Sig    atenoloL  (TENORMIN ) 25 mg Oral Tablet Take 1 Tablet (25 mg total) by mouth Daily for 90 days    BIOTIN ORAL Take 6,000 mcg by mouth Once a day    cholecalciferol, vitamin D3, 25 mcg (1,000 unit) Oral Tablet Take 2 Tablets (2,000 Units total) by mouth Daily    cyanocobalamin  (VITAMIN B12) 1,000 mcg/mL Injection Solution Inject 1 mL (1,000 mcg total) into the muscle Every 14 days Every 2 wks    esomeprazole  magnesium  (NEXIUM ) 40 mg Oral Capsule, Delayed Release(E.C.) Take 1 Capsule (40 mg  total) by mouth Every morning before breakfast    fluticasone  propionate (FLONASE ) 50 mcg/actuation Nasal Spray, Suspension INSTILL 1 SPRAY INTO NOSTRIL(S) TWICE DAILY AS NEEDED    hydroCHLOROthiazide  (HYDRODIURIL ) 12.5 mg Oral Tablet Take 1 Tablet (12.5 mg total) by mouth Once a day for 30 days    hydroCHLOROthiazide  (MICROZIDE ) 12.5 mg Oral Capsule Take 1 Capsule (12.5 mg total) by mouth Once a day Indications: high blood pressure    Ibuprofen  (MOTRIN ) 800 mg Oral Tablet Take 1 Tablet (800 mg total) by mouth Twice per day as needed    levothyroxine  (SYNTHROID) 75 mcg Oral Tablet Take 1 Tablet (75 mcg total) by mouth Once a day for 90 days    loratadine (CLARITIN) 10 mg Oral Tablet Take 1 Tablet (10 mg total) by mouth Daily OTC    magnesium  Oxide 500 mg magnesium  Oral Tablet Take 1 Tablet (500 mg total) by mouth Once a day (Patient taking differently: Take 400 mg by mouth Daily)    multivit-min/folic ac/collagen (WOMEN'S MULTIVITAMIN COLLAGEN ORAL) Take 1 Scoop by mouth Once a day    mupirocin  (BACTROBAN ) 2 % Ointment Apply topically Twice daily    prenatal vitamin-iron-folate Tablet Take 1 Tablet by mouth Daily    UNKNOWN MEDICATION (UNKNOWN MEDICATION) Take 10 mg by mouth Once a day     Family Medical History:       Problem Relation (Age of Onset)    Breast Cancer Sister    COPD Other    Congestive Heart Failure Other    Coronary Artery Disease Father    Elevated Lipids Other    Heart Attack Other    Heart Disease Brother    Hypertension (High Blood Pressure) Father    Kidney Stones Brother    No Known Problems Maternal Aunt, Maternal Uncle, Paternal Aunt, Paternal Uncle, Maternal Grandmother, Maternal Grandfather, Paternal Grandmother, Paternal Grandfather, Daughter, Son    Primary Brain tumor Sister    Stroke Mother            Social History     Socioeconomic History    Marital status: Widowed   Tobacco Use    Smoking status: Never    Smokeless tobacco: Never   Vaping Use    Vaping status: Never Used    Substance and Sexual Activity    Alcohol use: Never    Drug use: Never     Social Determinants of Health  Financial Resource Strain: Low Risk  (07/26/2021)    Financial Resource Strain     SDOH Financial: No   Transportation Needs: Low Risk  (07/26/2021)    Transportation Needs     SDOH Transportation: No   Social Connections: Low Risk  (07/26/2021)    Social Connections     SDOH Social Isolation: 5 or more times a week   Intimate Partner Violence: Low Risk  (07/26/2021)    Intimate Partner Violence     SDOH Domestic Violence: No   Housing Stability: Low Risk  (07/26/2021)    Housing Stability     SDOH Housing Situation: I have housing.     SDOH Housing Worry: No   Health Literacy: Unknown (08/02/2022)    Health Literacy     SDOH Health Literacy: Patient chooses not to answer.   Employment Status: Low Risk  (07/26/2021)    Employment Status     SDOH Employment: Otherwise unemployed but not seeking work (ex. Consulting civil engineer, retired, disabled, unpaid primary care giver)         List of Current Health Care Providers  Health Maintenance   Topic Date Due    Hepatitis C screening  Never done    RSV Adult 60+ or Pregnancy (1 - Risk 60-74 years 1-dose series) Never done    Pneumococcal Vaccination, Age 37+ (2 of 2 - PCV) 10/08/2015    Covid-19 Vaccine (4 - 2024-25 season) 12/25/2022    Breast Cancer Screening  12/14/2024    Osteoporosis screening  12/15/2027    Adult Tdap-Td (2 - Td or Tdap) 12/22/2030    Colonoscopy  08/12/2031    Shingles Vaccine  Completed    Medicare Annual Wellness Visit - Calendar Year Insurers  Completed     Medicare Wellness  Assessment  Medicare initial or wellness physical in the last year?: Yes  Advance Directives  Does patient have a living will or MPOA: Yes  Has patient provided [employer] with a copy?: Yes        Activities of Daily Living  Do you need help with dressing, bathing, or walking?: No  Do you need help with shopping, housekeeping, medications, or finances?: No  Do you have rugs in hallways, broken steps, or poor lighting?: Yes  Do you have grab bars in your bathroom, non-slip strips in your tub, and hand rails on your stairs?: Yes  Cognitive Function Screen (1=Yes, 0=No)  What is you age?: Correct  What is the time to the nearest hour?: Correct  What is the year?: Correct  What is the name of this clinic?: Incorrect  Can the patient recognize two persons (the doctor, the nurse, home help, etc.)?: Correct  What is the date of your birth? (day and month sufficient) : Correct  In what year did World War II end?: Incorrect  Who is the current president of the [place] ?: Correct  Count from 20 down to 1?: Correct  What address did I give you earlier?: Correct  Total Score: 8  Interpretation of Total Score: Greater than 6 Normal  Fall Risk Screen  Do you feel unsteady when standing or walking?: Yes  Do you worry about falling?: No  Have you fallen in the past year?: No    Hearing Screen   Have you noticed any hearing difficulties?: Yes  After whispering 9-1-6 how many numbers  did the patient repeat correctly?: 3       Vision Screen                                   OBJECTIVE:   There were no vitals taken for this visit.       Other appropriate exam:    Health Maintenance Due   Topic Date Due    Hepatitis C screening  Never done    RSV Adult 60+ or Pregnancy (1 - Risk 60-74 years 1-dose series) Never done    Pneumococcal Vaccination, Age 25+ (2 of 2 - PCV) 10/08/2015    Covid-19 Vaccine (4 - 2024-25 season) 12/25/2022      ASSESSMENT & PLAN:   Assessment/Plan   1. Medicare annual wellness visit, subsequent    2. 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 She is scheduled surgery soon and hopes to eliminate the fall risk.    Urinary Incontinence Plan of Care: Reassess at follow up visit  Orders Placed This Encounter    HEPATITIS C ANTIBODY SCREEN WITH REFLEX TO HCV PCR    Cologuard colon cancer screening     The patient has been educated about risk factors and recommended preventive care. Written Prevention Plan completed/ updated and given to patient (see After Visit Summary).    Return in about 2 weeks (around 08/04/2023).    Mills Alma, PA-C

## 2023-07-21 NOTE — Nursing Note (Signed)
 07/21/23 1629   Domestic Violence   Because we are aware of abuse and domestic violence today, we ask all patients: Are you being hurt, hit, or frightened by anyone at your home or in your life?  N   Basic Needs   Do you have any basic needs within your home that are not being met? (such as Food, Shelter, Civil Service fast streamer, Tranportation, paying for bills and/or medications) N

## 2023-07-21 NOTE — Assessment & Plan Note (Addendum)
 Continue meds as directed  Consider adjusting meds if BP continues to be elevated  Orders:    BASIC METABOLIC PANEL; Future    CBC/DIFF; Future

## 2023-07-21 NOTE — Nursing Note (Signed)
 07/21/23 1632   Comprehensive Health Assessment-Adult   Do you wish to complete this form? Yes   During the past 4 weeks, how would you rate your health in general? Good   During the past 4 weeks, how much difficulty have you had doing your usual activities inside and outside your home because of medical or emotional problems? A little bit of difficulty   During the past 4 weeks, was someone available to help you if you needed and wanted help? No   In the past year, how many times have you gone to the emergency department or been admitted to a hospital for a health problem? None   Are you generally satisfied with your sleep? No   Do you have enough money to buy things you need in everyday life, such as food, clothing, medicines, and housing? Yes, always   Can you get to places beyond walking distance without help?  (For example, can you drive your own car or travel alone on buses)? Yes   Do you fasten your seatbelt when you are in a car? Yes, usually   Do you exercise 20 minutes 3 or more days per week (such as walking, dancing, biking, mowing grass, swimming)? Yes, most of the time   How often do you eat food that is healthy (fruits, vegetables, lean meats) instead of unhealthy (sweets, fast food, junk food, fatty foods)? Some of the time   Have your parents, brothers or sisters had any of the following problems before the age of 48? (check all that apply) Heart problems, or hardening of the arteries;High cholesterol;Mental health problems such as depression, bipolar, severe anxiety, postpartum depression;Cancer   How often do you have trouble taking medicines the eay you are told to take them? I always take them as prescribed   Do you need any help communicating with your doctors and nurses because of vision or hearing problems? Yes   During the past 12 months, have you experienced confusion or memory loss that is happening more often or is getting worse? Possibly, I am not sure   Do you have one person you  think of as your personal doctor (primary care provider or family doctor)? Yes   If you are seeing a Primary Care Provider (PCP) or family doctor. please list their name Mills Alma   Are you now also seeing any specialist physician(s) (such as eye doctor, foot doctor, skin doctor)? Yes   If you are seeing a specialist for anything such as foot, eye, skin, etc.  please list their name(s) Eye doctor, Ortho, Cardio   How confident are you that you can control or manage most of your health problems? Somewhat confident

## 2023-07-28 ENCOUNTER — Encounter (INDEPENDENT_AMBULATORY_CARE_PROVIDER_SITE_OTHER): Payer: Self-pay | Admitting: PHYSICIAN ASSISTANT

## 2023-07-28 NOTE — Procedures (Deleted)
 FAMILY MEDICINE, MEDICAL OFFICE BUILDING  [address]  [place] [address]    Procedure Note    Name: Tonya Francis MRN:  [id number]   Date: 07/21/2023 DOB:  1955-08-06 (67 y.o.)         Procedures    M[place] Alma, PA-C

## 2023-08-02 ENCOUNTER — Ambulatory Visit (INDEPENDENT_AMBULATORY_CARE_PROVIDER_SITE_OTHER)

## 2023-08-02 ENCOUNTER — Other Ambulatory Visit: Payer: Self-pay

## 2023-08-02 ENCOUNTER — Other Ambulatory Visit: Attending: PHYSICIAN ASSISTANT | Admitting: PHYSICIAN ASSISTANT

## 2023-08-02 DIAGNOSIS — R790 Abnormal level of blood mineral: Secondary | ICD-10-CM | POA: Insufficient documentation

## 2023-08-02 DIAGNOSIS — I10 Essential (primary) hypertension: Secondary | ICD-10-CM

## 2023-08-02 DIAGNOSIS — E039 Hypothyroidism, unspecified: Secondary | ICD-10-CM

## 2023-08-02 DIAGNOSIS — Z0000 Encounter for general adult medical examination without abnormal findings: Secondary | ICD-10-CM

## 2023-08-02 DIAGNOSIS — E785 Hyperlipidemia, unspecified: Secondary | ICD-10-CM

## 2023-08-02 DIAGNOSIS — M2550 Pain in unspecified joint: Secondary | ICD-10-CM

## 2023-08-02 LAB — CBC WITH DIFF
BASOPHIL #: 0 10*3/uL (ref 0.00–0.10)
BASOPHIL %: 1 % (ref 0–1)
EOSINOPHIL #: 0.2 10*3/uL (ref 0.00–0.50)
EOSINOPHIL %: 3 % (ref 1–7)
HCT: 42.8 % — ABNORMAL HIGH (ref 31.2–41.9)
HGB: 14.4 g/dL — ABNORMAL HIGH (ref 10.9–14.3)
LYMPHOCYTE #: 1.7 10*3/uL (ref 1.10–3.10)
LYMPHOCYTE %: 31 % (ref 16–46)
MCH: 30.7 pg (ref 24.7–32.8)
MCHC: 33.6 g/dL (ref 32.3–35.6)
MCV: 91.6 fL (ref 75.5–95.3)
MONOCYTE #: 0.5 10*3/uL (ref 0.20–0.90)
MONOCYTE %: 8 % (ref 4–11)
MPV: 8.7 fL (ref 7.9–10.8)
NEUTROPHIL #: 3.2 10*3/uL (ref 1.90–8.20)
NEUTROPHIL %: 57 % (ref 43–77)
PLATELETS: 248 10*3/uL (ref 140–440)
RBC: 4.67 10*6/uL (ref 3.63–4.92)
RDW: 13.6 % (ref 12.3–17.7)
WBC: 5.6 10*3/uL (ref 3.8–11.8)

## 2023-08-02 LAB — HEPATIC FUNCTION PANEL
ALBUMIN/GLOBULIN RATIO: 1.7 — ABNORMAL HIGH (ref 0.8–1.4)
ALBUMIN: 4.6 g/dL (ref 3.5–5.7)
ALKALINE PHOSPHATASE: 114 U/L — ABNORMAL HIGH (ref 34–104)
ALT (SGPT): 19 U/L (ref 7–52)
AST (SGOT): 20 U/L (ref 13–39)
BILIRUBIN DIRECT: 0.06 md/dL (ref 0.03–0.18)
BILIRUBIN TOTAL: 0.7 mg/dL (ref 0.3–1.0)
BILIRUBIN, INDIRECT: 0.64 mg/dL (ref <=1)
GLOBULIN: 2.7 (ref 2.0–3.5)
PROTEIN TOTAL: 7.3 g/dL (ref 6.4–8.9)

## 2023-08-02 LAB — LIPID PANEL
CHOL/HDL RATIO: 3.2
CHOLESTEROL: 158 mg/dL (ref <200)
HDL CHOL: 50 mg/dL (ref >=40)
LDL CALC: 76 mg/dL (ref 0–100)
TRIGLYCERIDES: 159 mg/dL — ABNORMAL HIGH (ref <=150)
VLDL CALC: 32 mg/dL (ref 0–50)

## 2023-08-02 LAB — BASIC METABOLIC PANEL
ANION GAP: 8 mmol/L (ref 4–13)
BUN/CREA RATIO: 19 (ref 6–22)
BUN: 16 mg/dL (ref 7–25)
CALCIUM: 9.8 mg/dL (ref 8.6–10.3)
CHLORIDE: 105 mmol/L (ref 98–107)
CO2 TOTAL: 30 mmol/L (ref 21–31)
CREATININE: 0.85 mg/dL (ref 0.60–1.30)
ESTIMATED GFR: 74 mL/min/{1.73_m2} (ref >59)
GLUCOSE: 98 mg/dL (ref 74–109)
OSMOLALITY, CALCULATED: 286 mosm/kg (ref 270–290)
POTASSIUM: 3.8 mmol/L (ref 3.5–5.1)
SODIUM: 143 mmol/L (ref 136–145)

## 2023-08-02 LAB — THYROID STIMULATING HORMONE (SENSITIVE TSH): TSH: 0.927 u[IU]/mL (ref 0.450–5.330)

## 2023-08-02 LAB — MAGNESIUM: MAGNESIUM: 2 mg/dL (ref 1.9–2.7)

## 2023-08-02 LAB — VITAMIN B12: VITAMIN B 12: 544 pg/mL (ref 180–914)

## 2023-08-02 LAB — SEDIMENTATION RATE: ERYTHROCYTE SEDIMENTATION RATE (ESR): 16 mm/h (ref <30)

## 2023-08-03 LAB — HEPATITIS C ANTIBODY SCREEN WITH REFLEX TO HCV PCR: HCV ANTIBODY QUALITATIVE: NEGATIVE

## 2023-08-03 LAB — RHEUMATOID FACTOR, SERUM: RHEUMATOID FACTOR: <13 [IU]/mL (ref <=30)

## 2023-08-03 LAB — HEP-2 SUBSTRATE ANTINUCLEAR ANTIBODIES (ANA), SERUM: ANA INTERPRETATION: NEGATIVE

## 2023-08-04 ENCOUNTER — Ambulatory Visit (INDEPENDENT_AMBULATORY_CARE_PROVIDER_SITE_OTHER): Payer: Self-pay | Admitting: PHYSICIAN ASSISTANT

## 2023-08-04 DIAGNOSIS — Z1211 Encounter for screening for malignant neoplasm of colon: Secondary | ICD-10-CM

## 2023-08-07 ENCOUNTER — Encounter (INDEPENDENT_AMBULATORY_CARE_PROVIDER_SITE_OTHER): Payer: Self-pay | Admitting: PHYSICIAN ASSISTANT

## 2023-08-07 ENCOUNTER — Encounter (INDEPENDENT_AMBULATORY_CARE_PROVIDER_SITE_OTHER): Payer: Self-pay

## 2023-08-07 LAB — COLOGUARD® COLON CANCER SCREEN: COLOGUARD RESULT: POSITIVE — AB

## 2023-08-07 NOTE — Nursing Note (Signed)
 The influenza vaccine was not given because the patient/caregiver declined.

## 2023-08-11 ENCOUNTER — Ambulatory Visit: Attending: Internal Medicine

## 2023-08-11 ENCOUNTER — Encounter (INDEPENDENT_AMBULATORY_CARE_PROVIDER_SITE_OTHER): Payer: Self-pay

## 2023-08-11 ENCOUNTER — Other Ambulatory Visit: Payer: Self-pay

## 2023-08-11 VITALS — BP 140/80 | HR 64 | Temp 98.1°F | Resp 16 | Ht 66.0 in | Wt 162.0 lb

## 2023-08-11 DIAGNOSIS — J309 Allergic rhinitis, unspecified: Secondary | ICD-10-CM

## 2023-08-11 DIAGNOSIS — Z79899 Other long term (current) drug therapy: Secondary | ICD-10-CM | POA: Insufficient documentation

## 2023-08-11 MED ORDER — LEVOCETIRIZINE 5 MG TABLET
5.0000 mg | ORAL_TABLET | Freq: Every evening | ORAL | 1 refills | Status: DC
Start: 2023-08-11 — End: 2024-01-30

## 2023-08-11 MED ORDER — METHYLPREDNISOLONE 4 MG TABLETS IN A DOSE PACK
ORAL_TABLET | ORAL | 0 refills | Status: DC
Start: 2023-08-11 — End: 2023-09-25

## 2023-08-11 NOTE — Progress Notes (Signed)
 FAMILY MEDICINE, MEDICAL OFFICE BUILDING  [address]  [place] [address]  Operated by [facility]    Tonya Francis  Sep 12, 1955  [id number]    Date of Service: 08/11/2023  2:45 PM EDT    Chief complaint:   Chief Complaint   Patient presents with   . Runny Nose   . Cough   . Sore Throat     Subjective:   This is a case of a 68 y.o. female who presents today for complaint(s) of: Allergic Rhinitis  Patient presents for evaluation of allergic symptoms.  Symptoms include rhinorrhea, morning sore throat, cough and are present in a seasonal pattern. Treatment in the past has included intranasal steroids: Singulair, Claritin and Flonase . Patient was recently forced to discontinue Singulair and Claritin due to side effects. Current use of Flonase  and Vick's ineffective.    History:  Active Ambulatory Problems     Diagnosis Date Noted   . Asthma without status asthmaticus 07/26/2021   . Headache 07/26/2021   . Hypertension 04/05/2018   . Microscopic hematuria 07/26/2021   . Recurrent major depressive disorder 07/26/2021   . Situational anxiety 04/05/2018   . Thickened endometrium 12/17/2018   . 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 Tremor 07/26/2021   . Vitamin D  deficiency 04/05/2018   . Antibiotic-associated diarrhea 04/05/2018   . Bereavement, uncomplicated 09/21/2021   . Chronic sinusitis 09/21/2021   . Deviated septum 09/21/2021   . Disorder of gallbladder 09/21/2021   . Disorder of lumbosacral intervertebral disc 09/21/2021   . 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 Flat foot 09/21/2021   . 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 Lumbosacral spondylosis without myelopathy 09/21/2021   . 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 UTI (urinary tract infection) 07/26/2021   . Cardiac disease 07/26/2021   . History of tonsillitis  09/21/2021   . Indigestion 09/21/2021     Past Medical History:   Diagnosis Date   . Allergic rhinitis    . Arrhythmia    . B12 deficiency    . Cancer (CMS HCC)    . DDD (degenerative disc disease), lumbar    . Depression    . Disorder of thyroid     . Dyslipidemia    . Elevated LFTs    . Esophageal reflux    . Fluttering sensation of heart    . Hearing loss    . Hiatal hernia    . History of acne rosacea    . History of colonic polyps    . History of kidney stones    . HNP (herniated nucleus pulposus), lumbar    . Hx of Cataract    . Hx of Osteoarthritis    . Lung nodule    . Palpitations    . Pre-diabetes    . Rosacea      Current Outpatient Medications   Medication Sig   . atenoloL  (TENORMIN ) 25 mg Oral Tablet Take 1 Tablet (25 mg total) by mouth Daily for 90 days   . BIOTIN ORAL Take 6,000 mcg by mouth Once a day   . cholecalciferol, vitamin D3, 25 mcg (1,000 unit) Oral Tablet Take 2 Tablets (2,000 Units total) by mouth Daily   . cyanocobalamin  (VITAMIN B12) 1,000 mcg/mL Injection Solution Inject 1 mL (1,000 mcg total) into  the muscle Every 14 days Every 2 wks   . esomeprazole  magnesium  (NEXIUM ) 40 mg Oral Capsule, Delayed Release(E.C.) Take 1 Capsule (40 mg total) by mouth Every morning before breakfast   . fluticasone  propionate (FLONASE ) 50 mcg/actuation Nasal Spray, Suspension INSTILL 1 SPRAY INTO NOSTRIL(S) TWICE DAILY AS NEEDED   . hydroCHLOROthiazide  (HYDRODIURIL ) 12.5 mg Oral Tablet Take 1 Tablet (12.5 mg total) by mouth Once a day for 30 days   . hydroCHLOROthiazide  (MICROZIDE ) 12.5 mg Oral Capsule Take 1 Capsule (12.5 mg total) by mouth Once a day Indications: high blood pressure   . Ibuprofen  (MOTRIN ) 800 mg Oral Tablet Take 1 Tablet (800 mg total) by mouth Twice per day as needed   . Levocetirizine (XYZAL) 5 mg Oral Tablet Take 1 Tablet (5 mg total) by mouth Every evening   . levothyroxine  (SYNTHROID) 75 mcg Oral Tablet Take 1 Tablet (75 mcg total) by mouth Once a day for 90 days   . magnesium  Oxide  500 mg magnesium  Oral Tablet Take 1 Tablet (500 mg total) by mouth Once a day (Patient taking differently: Take 400 mg by mouth Daily)   . Methylprednisolone  (MEDROL  DOSEPACK) 4 mg Oral Tablets, Dose Pack Take as instructed.   . multivit-min/folic ac/collagen (WOMEN'S MULTIVITAMIN COLLAGEN ORAL) Take 1 Scoop by mouth Once a day   . mupirocin  (BACTROBAN ) 2 % Ointment Apply topically Twice daily   . prenatal vitamin-iron-folate Tablet Take 1 Tablet by mouth Daily     Review of Systems:  Any pertinent Review of Systems as addressed in the HPI above.    Objective   BP (!) 140/80   Pulse 64   Temp 36.7 C (98.1 F) (Temporal)   Resp 16   Ht 1.676 m (5\' 6" )   Wt 73.5 kg (162 lb)   SpO2 97%   BMI 26.15 kg/m     Physical Exam  Vitals reviewed.   Constitutional:       General: She is not in acute distress.     Appearance: Normal appearance.   HENT:      Nose: Rhinorrhea present.      Right Turbinates: Enlarged.      Left Turbinates: Enlarged.      Mouth/Throat:      Mouth: Mucous membranes are moist.      Pharynx: No oropharyngeal exudate or posterior oropharyngeal erythema.   Cardiovascular:      Rate and Rhythm: Normal rate and regular rhythm.      Pulses: Normal pulses.      Heart sounds: Normal heart sounds.   Pulmonary:      Effort: Pulmonary effort is normal.      Breath sounds: Normal breath sounds.   Skin:     General: Skin is warm and dry.      Capillary Refill: Capillary refill takes less than 2 seconds.   Neurological:      General: No focal deficit present.      Mental Status: She is alert and oriented to person, place, and time. Mental status is at baseline.   Psychiatric:         Attention and Perception: Attention normal.         Mood and Affect: Mood and affect normal.         Speech: Speech normal.         Behavior: Behavior normal. Behavior is cooperative.         Thought Content: Thought content normal.      Assessment &  Plan  Allergic rhinitis  Patient advised on the available treatment options.  Xyzal and Medrol  dose pack ordered.    The patient was given the opportunity to ask questions and those questions were answered to the patient's satisfaction. The patient was encouraged to call with any additional questions or concerns. I instructed the patient to follow-up if symptoms persist or worsen. Medication safety was discussed. A good faith effort was made to reconcile the patient's medications. More than 50% of the visit was spent counseling and coordinating care.    Orders Placed This Encounter   . Levocetirizine (XYZAL) 5 mg Oral Tablet   . Methylprednisolone  (MEDROL  DOSEPACK) 4 mg Oral Tablets, Dose Pack     Follow up: Return if symptoms worsen or fail to improve.  Willadean Hark, APRN,NP-C

## 2023-08-11 NOTE — Nursing Note (Signed)
 08/11/23 1440   Health Education and Literacy   How often do you have a problem understanding what is told to you about your medical condition?  Never   Domestic Violence   Because we are aware of abuse and domestic violence today, we ask all patients: Are you being hurt, hit, or frightened by anyone at your home or in your life?  N   Basic Needs   Do you have any basic needs within your home that are not being met? (such as Food, Shelter, [employer] fast streamer, Tranportation, paying for bills and/or medications) N   Advanced Directives   Do you have any advanced directives? Living Will & MPOA

## 2023-08-14 ENCOUNTER — Ambulatory Visit: Payer: Self-pay | Attending: PHYSICIAN ASSISTANT | Admitting: PHYSICIAN ASSISTANT

## 2023-08-14 ENCOUNTER — Encounter (INDEPENDENT_AMBULATORY_CARE_PROVIDER_SITE_OTHER): Payer: Self-pay | Admitting: PHYSICIAN ASSISTANT

## 2023-08-14 ENCOUNTER — Other Ambulatory Visit: Payer: Self-pay

## 2023-08-14 VITALS — BP 130/76 | HR 65 | Temp 97.6°F | Ht 66.0 in | Wt 160.0 lb

## 2023-08-14 DIAGNOSIS — J329 Chronic sinusitis, unspecified: Secondary | ICD-10-CM | POA: Insufficient documentation

## 2023-08-14 DIAGNOSIS — M19049 Primary osteoarthritis, unspecified hand: Secondary | ICD-10-CM | POA: Insufficient documentation

## 2023-08-14 DIAGNOSIS — Z01419 Encounter for gynecological examination (general) (routine) without abnormal findings: Secondary | ICD-10-CM | POA: Insufficient documentation

## 2023-08-14 DIAGNOSIS — N882 Stricture and stenosis of cervix uteri: Secondary | ICD-10-CM | POA: Insufficient documentation

## 2023-08-14 DIAGNOSIS — L989 Disorder of the skin and subcutaneous tissue, unspecified: Secondary | ICD-10-CM | POA: Insufficient documentation

## 2023-08-14 DIAGNOSIS — Z1151 Encounter for screening for human papillomavirus (HPV): Secondary | ICD-10-CM | POA: Insufficient documentation

## 2023-08-14 DIAGNOSIS — R9389 Abnormal findings on diagnostic imaging of other specified body structures: Secondary | ICD-10-CM | POA: Insufficient documentation

## 2023-08-14 DIAGNOSIS — Z124 Encounter for screening for malignant neoplasm of cervix: Secondary | ICD-10-CM | POA: Insufficient documentation

## 2023-08-14 DIAGNOSIS — Z0001 Encounter for general adult medical examination with abnormal findings: Secondary | ICD-10-CM | POA: Insufficient documentation

## 2023-08-14 DIAGNOSIS — Z1283 Encounter for screening for malignant neoplasm of skin: Secondary | ICD-10-CM | POA: Insufficient documentation

## 2023-08-14 MED ORDER — CEFUROXIME AXETIL 500 MG TABLET
500.0000 mg | ORAL_TABLET | Freq: Two times a day (BID) | ORAL | 0 refills | Status: DC
Start: 2023-08-14 — End: 2023-11-03

## 2023-08-14 NOTE — Progress Notes (Unsigned)
 FAMILY MEDICINE, MEDICAL OFFICE BUILDING  [address]  [place] [address]  Operated by [facility]    Progress Note    Tonya Francis  [birth year]  [id number]    Date of Service: 08/14/2023  9:45 AM EDT    Chief complaint:   Chief Complaint   Patient presents with    Follow Up     1 month follow up   Saw Tonya Francis Friday but still complains of sinus issues.        Subjective:     This is a case of a 68 y.o. year old female who comes in today for follow up. Pt.reports her hands numbness resolved after d/c the hydrocloric acid cream. No left hand numbness and right hand is mild numbness, no trouble with objects. Pt.saw Tonya Francis Friday but now her nasal and sputum turned clear milky to light green. Headache resolved,hearing ok but ears are popping, sneezing, pt.didn't take medroldose pack he ordered. She eat and drink ok. No sore throat, no headache now, no fever or chills. Pt.states right wrist with a cyst-ganglion noted. Pt.scheduled to see Tonya Francis  for Cologuard positive and discuss her ganglion cyst. Pt.states she is also here for CBE and Pap.       Review of Systems   Genitourinary:         Pt.denies any genitourinary sx. No sexual activity for over 10 years. No vag bleeding or discharge.   Skin:         Pt.concerned over skin lesions, she has hx of skin cancer.    see above HPI also.    Current Outpatient Medications   Medication Sig    atenoloL  (TENORMIN ) 25 mg Oral Tablet Take 1 Tablet (25 mg total) by mouth Daily for 90 days    BIOTIN ORAL Take 6,000 mcg by mouth Once a day    cefuroxime  (CEFTIN ) 500 mg Oral Tablet Take 1 Tablet (500 mg total) by mouth Twice daily    cholecalciferol, vitamin D3, 25 mcg (1,000 unit) Oral Tablet Take 2 Tablets (2,000 Units total) by mouth Daily    cyanocobalamin  (VITAMIN B12) 1,000 mcg/mL Injection Solution Inject 1 mL (1,000 mcg total) into the muscle Every 14 days Every 2 wks    esomeprazole  magnesium  (NEXIUM ) 40 mg Oral Capsule, Delayed Release(E.C.)  Take 1 Capsule (40 mg total) by mouth Every morning before breakfast    fluticasone  propionate (FLONASE ) 50 mcg/actuation Nasal Spray, Suspension INSTILL 1 SPRAY INTO NOSTRIL(S) TWICE DAILY AS NEEDED    hydroCHLOROthiazide  (HYDRODIURIL ) 12.5 mg Oral Tablet Take 1 Tablet (12.5 mg total) by mouth Once a day for 30 days    hydroCHLOROthiazide  (MICROZIDE ) 12.5 mg Oral Capsule Take 1 Capsule (12.5 mg total) by mouth Once a day Indications: high blood pressure    Ibuprofen  (MOTRIN ) 800 mg Oral Tablet Take 1 Tablet (800 mg total) by mouth Twice per day as needed    Levocetirizine (XYZAL) 5 mg Oral Tablet Take 1 Tablet (5 mg total) by mouth Every evening    levothyroxine  (SYNTHROID) 75 mcg Oral Tablet Take 1 Tablet (75 mcg total) by mouth Once a day for 90 days    magnesium  Oxide 500 mg magnesium  Oral Tablet Take 1 Tablet (500 mg total) by mouth Once a day    Methylprednisolone  (MEDROL  DOSEPACK) 4 mg Oral Tablets, Dose Pack Take as instructed. (Patient not taking: Reported on 08/14/2023)    multivit-min/folic ac/collagen (WOMEN'S MULTIVITAMIN COLLAGEN ORAL) Take 1 Scoop by mouth Once a day  mupirocin  (BACTROBAN ) 2 % Ointment Apply topically Twice daily    prenatal vitamin-iron-folate Tablet Take 1 Tablet by mouth Daily       Objective:     BP 130/76   Pulse 65   Temp 36.4 C (97.6 F) (Temporal)   Ht 1.676 m (5\' 6" )   Wt 72.6 kg (160 lb)   SpO2 97%   BMI 25.82 kg/m     BMI addressed:  Pts.BMI is 25.82 wnl     Physical Exam  Vitals and nursing note reviewed. Exam conducted with a chaperone present.   Constitutional:       General: She is not in acute distress.     Appearance: Normal appearance. She is normal weight.   HENT:      Head: Normocephalic and atraumatic.      Right Ear: Tympanic membrane normal.      Left Ear: Tympanic membrane normal.      Nose: Nose normal.      Mouth/Throat:      Mouth: Mucous membranes are moist.      Pharynx: Oropharynx is clear.   Eyes:      Extraocular Movements: Extraocular  movements intact.      Conjunctiva/sclera: Conjunctivae normal.   Neck:      Thyroid : No thyromegaly.      Vascular: No carotid bruit.   Cardiovascular:      Rate and Rhythm: Normal rate and regular rhythm.      Pulses: Normal pulses.      Heart sounds: Normal heart sounds. No murmur heard.  Pulmonary:      Effort: Pulmonary effort is normal.      Breath sounds: Normal breath sounds.   Chest:      Chest wall: No mass, lacerations or tenderness.   Breasts:     Right: Normal. No swelling, bleeding, inverted nipple, mass, nipple discharge, skin change or tenderness.      Left: Normal. No swelling, bleeding, inverted nipple, mass, nipple discharge, skin change or tenderness.   Abdominal:      General: Abdomen is flat. Bowel sounds are normal.      Palpations: Abdomen is soft.   Genitourinary:     General: Normal vulva.      Comments: Tonya Francischaperoned exam, pale external genitalia and vaginal vault dry, cervix stenotic, bimanual deferred due to ordering a follow up abnormal pelvic u/s in past. Pt.did cologurad and results were positive and she's scheduled see Tonya Francis.  Musculoskeletal:         General: Deformity present. No swelling. Normal range of motion.      Cervical back: Normal range of motion. No tenderness.      Comments: Arthritis changes of fingers and hands   Lymphadenopathy:      Cervical: No cervical adenopathy.      Upper Body:      Right upper body: No supraclavicular, axillary or pectoral adenopathy.      Left upper body: No supraclavicular, axillary or pectoral adenopathy.   Skin:     General: Skin is warm and dry.      Comments: Numerous hyperpigmented skin lesions on arms,neck mostly   Neurological:      General: No focal deficit present.      Mental Status: She is alert. Mental status is at baseline.      Cranial Nerves: No cranial nerve deficit.      Motor: No weakness.   Psychiatric:         Mood  and Affect: Mood normal.         Thought Content: Thought content normal.            Assessment & Plan  Screening for cervical cancer    Orders:    THIN PREP PAP AND HPV MRNA ([facility]); Future    Abnormal pelvic ultrasound    Orders:    US  PELVIS NON OB TRANSABDOMINAL [[id number]]; Future    Skin exam, screening for cancer    Orders:    Referral to External Provider    Encounter for breast and pelvic examination         Sinusitis  Ceftin  500 mg 1 bid for 10days  Start the Medrol  Dose Pack as ordered by Rannie Byars.            Other orders    cefuroxime  (CEFTIN ) 500 mg Oral Tablet; Take 1 Tablet (500 mg total) by mouth Twice daily          The patient was given the opportunity to ask questions and those questions were answered to the patient's satisfaction. The patient was encouraged to call with any additional questions or concerns.    Follow up: Return in about 7 weeks (around 10/02/2023).    Mills Alma, PA-C

## 2023-08-21 ENCOUNTER — Other Ambulatory Visit: Payer: Self-pay

## 2023-08-21 ENCOUNTER — Ambulatory Visit (INDEPENDENT_AMBULATORY_CARE_PROVIDER_SITE_OTHER): Payer: Self-pay | Admitting: Surgery

## 2023-08-21 ENCOUNTER — Encounter (INDEPENDENT_AMBULATORY_CARE_PROVIDER_SITE_OTHER): Payer: Self-pay | Admitting: Surgery

## 2023-08-21 VITALS — BP 141/83 | HR 76 | Temp 98.9°F | Resp 18 | Ht 66.0 in | Wt 160.0 lb

## 2023-08-21 DIAGNOSIS — Z8719 Personal history of other diseases of the digestive system: Secondary | ICD-10-CM

## 2023-08-21 DIAGNOSIS — R195 Other fecal abnormalities: Secondary | ICD-10-CM

## 2023-08-21 LAB — THIN PREP PAP AND HPV MRNA (QUEST): HPV MRNA E6/E7: NOT DETECTED

## 2023-08-21 MED ORDER — SUTAB 1.479-0.188-0.225 GRAM TABLET
ORAL_TABLET | ORAL | 0 refills | Status: DC
Start: 2023-08-21 — End: 2024-02-07

## 2023-08-21 NOTE — Progress Notes (Signed)
 GENERAL SURGERY, [facility]  [address]  [place] [address]    History and Physical    Name: Tonya Francis MRN:  [id number]   Date: 08/21/2023 DOB:  [birth year] (67 y.o.)              Reason for Visit: Colonoscopy    History of Present Illness  Tonya Francis presents today for colonoscopy because of positive Cologuard.  The patient did undergo high-risk screening colonoscopy on 08/11/2021 which showed mild sigmoid diverticulosis, redundant colon, scattered diverticulosis throughout as well as grade 2 hemorrhoids.  The patient denies any complaints or problems.    Negative diabetes, blood thinner, family history colon cancer      Review of the result(s) of each unique test:  Patient underwent diagnostic testing ( as above) prior to this dates visit.  I have personally reviewed the results and that serves as a component of the medical decision making for this encounter       Review of prior external note(s) from each unique source:  Patients referral to this office including a recent assessment by the referring provider.  This was reviewed by me for this unique office visit for the indication and intent of the referral as well as any pertinent medical or surgical history relevant to the patients independent evaluation by me today.      Patient History  Past Medical History:   Diagnosis Date    Allergic rhinitis     Arrhythmia     B12 deficiency     Cancer (CMS HCC)     basal cell skin cancer    Cardiac disease 07/26/2021    DDD (degenerative disc disease), lumbar     Depression     Disorder of thyroid      Dyslipidemia     Elevated LFTs     Esophageal reflux     Fluttering sensation of heart     Hearing loss     Hiatal hernia     History of acne rosacea     History of colonic polyps     History of kidney stones     HNP (herniated nucleus pulposus), lumbar     Hx of Cataract     Left    Hx of Osteoarthritis     Hypertension     Lung nodule     Palpitations     Pre-diabetes      Rosacea     Tremor     Vitamin D  deficiency          Past Surgical History:   Procedure Laterality Date    ABDOMINAL SURGERY      BOWEL BLOCKAGE    COLONOSCOPY      2014, 2023    DENTAL SURGERY      HEMORROIDECTOMY      HX CATARACT REMOVAL      HX CHOLECYSTECTOMY      HX CYSTOSCOPY      BIOPSY OF UTERUS    HX EYE SURGERY      HX THYROID  BIOPSY      FNA Bx Benign 2021    HX TONSILLECTOMY      HX TUBAL LIGATION      KNEE ARTHROSCOPY Right     KNEE SURGERY      laproscopic    ORTHOPEDIC SURGERY Right     BROKEN RIGHT ARM         Current Outpatient Medications   Medication Sig  atenoloL  (TENORMIN ) 25 mg Oral Tablet Take 1 Tablet (25 mg total) by mouth Daily for 90 days    BIOTIN ORAL Take 6,000 mcg by mouth Once a day    cefuroxime  (CEFTIN ) 500 mg Oral Tablet Take 1 Tablet (500 mg total) by mouth Twice daily    cholecalciferol, vitamin D3, 25 mcg (1,000 unit) Oral Tablet Take 2 Tablets (2,000 Units total) by mouth Daily    cyanocobalamin  (VITAMIN B12) 1,000 mcg/mL Injection Solution Inject 1 mL (1,000 mcg total) into the muscle Every 14 days Every 2 wks    esomeprazole  magnesium  (NEXIUM ) 40 mg Oral Capsule, Delayed Release(E.C.) Take 1 Capsule (40 mg total) by mouth Every morning before breakfast    fluticasone  propionate (FLONASE ) 50 mcg/actuation Nasal Spray, Suspension INSTILL 1 SPRAY INTO NOSTRIL(S) TWICE DAILY AS NEEDED    hydroCHLOROthiazide  (HYDRODIURIL ) 12.5 mg Oral Tablet Take 1 Tablet (12.5 mg total) by mouth Once a day for 30 days    hydroCHLOROthiazide  (MICROZIDE ) 12.5 mg Oral Capsule Take 1 Capsule (12.5 mg total) by mouth Once a day Indications: high blood pressure    Ibuprofen  (MOTRIN ) 800 mg Oral Tablet Take 1 Tablet (800 mg total) by mouth Twice per day as needed    Levocetirizine (XYZAL) 5 mg Oral Tablet Take 1 Tablet (5 mg total) by mouth Every evening    levothyroxine  (SYNTHROID) 75 mcg Oral Tablet Take 1 Tablet (75 mcg total) by mouth Once a day for 90 days    magnesium  Oxide 500 mg magnesium  Oral  Tablet Take 1 Tablet (500 mg total) by mouth Once a day    Methylprednisolone  (MEDROL  DOSEPACK) 4 mg Oral Tablets, Dose Pack Take as instructed. (Patient not taking: Reported on 08/14/2023)    multivit-min/folic ac/collagen (WOMEN'S MULTIVITAMIN COLLAGEN ORAL) Take 1 Scoop by mouth Once a day    mupirocin  (BACTROBAN ) 2 % Ointment Apply topically Twice daily    prenatal vitamin-iron-folate Tablet Take 1 Tablet by mouth Daily    sod sulf-pot chloride-mag sulf (SUTAB ) 1.479-0.188- 0.225 gram Oral Tablet Take 12 pills at 10-10:30 am. Take 12 pills at 6-6:30pm. Drink plenty of fluids all day/evening.     Allergies   Allergen Reactions    Latex Itching    Sulfa (Sulfonamides) Swelling and Angioedema    Latex Rash     Family Medical History:       Problem Relation (Age of Onset)    Breast Cancer Sister    COPD Other    Congestive Heart Failure Other    Coronary Artery Disease Father    Elevated Lipids Other    Heart Attack Other    Heart Disease Brother    Hypertension (High Blood Pressure) Father    Kidney Stones Brother    No Known Problems Maternal Aunt, Maternal Uncle, Paternal Aunt, Paternal Uncle, Maternal Grandmother, Maternal Grandfather, Paternal Grandmother, Paternal Grandfather, Daughter, Son    Primary Brain tumor Sister    Stroke Mother            Social History     Tobacco Use    Smoking status: Never    Smokeless tobacco: Never   Vaping Use    Vaping status: Never Used   Substance Use Topics    Alcohol use: Never    Drug use: Never            Physical Examination:  Vitals:    08/21/23 1432   BP: (!) 141/83   Pulse: 76   Resp: 18   Temp: 37.2 C (  98.9 F)   SpO2: 99%   Weight: 72.6 kg (160 lb)   Height: 1.676 m (5\' 6" )   BMI: 25.82        General: appropriate for age. in no acute distress.    Vital signs are present above and have been reviewed by me     HEENT: Atraumatic, Normocephalic. PERRLA. EOMI. Nose clear. Throat clear    Lungs: Nonlabored breathing with symmetric expansion. Clear to auscultation  bilaterally    Heart:Regular wth respect to rate and rythmn.    Abdomen:Soft. Nontender. Nondistended and benign otherwise    Extremities: Grossly normal. No major deformities     Neuro:  Grossly normal motor and sensory function    Psychiatric: Alert and oriented to person, place, and time. affect appropriate      Assessment and Plan  Colonoscopy scheduled for 10/18/2023 at 12:30 p.m.     Discussed indications, risks, and benefits of colonoscopy with possible biopsy/polypectomy with the patient.  Discussed the possibility of polypectomy, biopsies, and possible repeat examinations.  Risks include bleeding, sedation risks, possibility of missed diagnosis of polyp or malignancy, and remote possibilities of perforation and death.  All questions were answered, and informed consent was clearly obtained.      Follow Up:  No follow-ups on file.      ICD-10-CM    1. Positive colorectal cancer screening using Cologuard test  R19.5           Wanette Robison B Neizan Debruhl, MD ,MBA,FACS    I appreciate the opportunity to be involved in the care of your patients.  If you have any questions or concerns regarding this encounter, please do not hesitate to contact me at your convenience.      This note may have been partially generated using MModal Fluency Direct system, and there may be some incorrect words, spellings, and punctuation that were not noted in checking the note before saving, though effort was made to avoid such errors.

## 2023-08-22 ENCOUNTER — Encounter (INDEPENDENT_AMBULATORY_CARE_PROVIDER_SITE_OTHER): Payer: Self-pay | Admitting: PHYSICIAN ASSISTANT

## 2023-08-22 ENCOUNTER — Other Ambulatory Visit (INDEPENDENT_AMBULATORY_CARE_PROVIDER_SITE_OTHER): Payer: Self-pay | Admitting: Surgery

## 2023-08-22 ENCOUNTER — Ambulatory Visit (INDEPENDENT_AMBULATORY_CARE_PROVIDER_SITE_OTHER): Payer: Self-pay | Admitting: PHYSICIAN ASSISTANT

## 2023-08-22 DIAGNOSIS — N888 Other specified noninflammatory disorders of cervix uteri: Secondary | ICD-10-CM

## 2023-08-22 MED ORDER — SUTAB 1.479-0.188-0.225 GRAM TABLET
ORAL_TABLET | ORAL | 0 refills | Status: DC
Start: 2023-08-22 — End: 2024-01-03

## 2023-09-08 ENCOUNTER — Other Ambulatory Visit: Payer: Self-pay

## 2023-09-08 ENCOUNTER — Ambulatory Visit
Admission: RE | Admit: 2023-09-08 | Discharge: 2023-09-08 | Disposition: A | Discharge status: Home / Self Care | Payer: Self-pay | Source: Ambulatory Visit | Attending: PHYSICIAN ASSISTANT

## 2023-09-08 DIAGNOSIS — R9389 Abnormal findings on diagnostic imaging of other specified body structures: Secondary | ICD-10-CM | POA: Insufficient documentation

## 2023-09-08 DIAGNOSIS — N888 Other specified noninflammatory disorders of cervix uteri: Secondary | ICD-10-CM

## 2023-09-12 ENCOUNTER — Ambulatory Visit (HOSPITAL_COMMUNITY): Payer: Self-pay

## 2023-09-25 ENCOUNTER — Telehealth (INDEPENDENT_AMBULATORY_CARE_PROVIDER_SITE_OTHER): Payer: Self-pay | Admitting: PHYSICIAN ASSISTANT

## 2023-09-28 ENCOUNTER — Other Ambulatory Visit: Payer: Self-pay

## 2023-09-28 ENCOUNTER — Ambulatory Visit: Payer: Self-pay | Attending: Internal Medicine | Admitting: PHYSICIAN ASSISTANT

## 2023-09-28 ENCOUNTER — Encounter (INDEPENDENT_AMBULATORY_CARE_PROVIDER_SITE_OTHER): Payer: Self-pay | Admitting: PHYSICIAN ASSISTANT

## 2023-09-28 VITALS — BP 118/76 | HR 74 | Temp 97.7°F | Ht 66.0 in | Wt 166.0 lb

## 2023-09-28 DIAGNOSIS — L259 Unspecified contact dermatitis, unspecified cause: Secondary | ICD-10-CM | POA: Insufficient documentation

## 2023-09-28 DIAGNOSIS — G4700 Insomnia, unspecified: Secondary | ICD-10-CM | POA: Insufficient documentation

## 2023-09-28 DIAGNOSIS — Z9889 Other specified postprocedural states: Secondary | ICD-10-CM | POA: Insufficient documentation

## 2023-09-28 DIAGNOSIS — Z09 Encounter for follow-up examination after completed treatment for conditions other than malignant neoplasm: Secondary | ICD-10-CM | POA: Insufficient documentation

## 2023-09-28 MED ORDER — TRAZODONE 50 MG TABLET
ORAL_TABLET | ORAL | 0 refills | Status: DC
Start: 2023-09-28 — End: 2023-11-03

## 2023-09-28 MED ORDER — FLUCONAZOLE 100 MG TABLET
100.0000 mg | ORAL_TABLET | Freq: Every day | ORAL | 0 refills | Status: AC
Start: 2023-09-28 — End: 2023-10-03

## 2023-09-28 NOTE — Progress Notes (Signed)
 FAMILY MEDICINE, MEDICAL OFFICE BUILDING  [address]  [place] [address]  Operated by [facility]  Transitional Care Management Note    Name: Tonya Francis MRN:  [id number]   Date: 09/28/2023 Age: 68 y.o.     Chief Complaint: Hospital Follow Up (Per pt she states that it has been a hard time with post op, she had an allergic reaction on her knee as well. Also would like diflucan  called in for being on a lot of abx.) and Hospital Discharge Transition       SUBJECTIVE:  Tonya Francis is a 68 y.o. female presenting today for follow-up after being discharged. The main problem requiring admission was R knee surgery with Dr. Azzo. Had f/u with ortho NP yesterday. Has f/u with her ortho surgeon in 4 weeks. She is getting PT through home health but notes it is on hold secondary to a rash on her knee. Steroid shot yesterday for rash. Denies itching, stinging. She was also start on Keflex. Denies fever, chills, nausea, vomiting, chest pain, SOB. Taking ASA bid. She notes trouble sleeping. Would like to try something to help her sleep.       OBJECTIVE:   BP 118/76   Pulse 74   Temp 36.5 C (97.7 F) (Temporal)   Ht 1.676 m (5\' 6" )   Wt 75.3 kg (166 lb)   SpO2 100%   BMI 26.79 kg/m      Physical Exam  Constitutional:       Appearance: Normal appearance.   HENT:      Head: Normocephalic and atraumatic.   Cardiovascular:      Rate and Rhythm: Normal rate and regular rhythm.   Pulmonary:      Effort: Pulmonary effort is normal.      Breath sounds: Normal breath sounds.   Musculoskeletal:      Right lower leg: Edema present.      Comments: There is erythema around the surgical site but it does not seem to be infection as the incision site itself it dry and maintain good margins.    Neurological:      General: No focal deficit present.      Mental Status: She is alert and oriented to person, place, and time.   Psychiatric:         Behavior: Behavior normal.       Transition of Care Contact  Information  Discharge date: Discharge Date: 09/21/2023  Transition Facility Type--Hospital (Inpatient or Observation)  Facility Name--[facility] Interactive Contact(s):  Completed Contact: 09/25/2023 11:18 AM  First Attempt Call: 09/25/2023 11:07 AM  Contact Method(s)-- Patient/Caregiver Telephone  Clinical Staff Name/Role who contacted--Melissa Kosinar, LPN     Data Reviewed  Medication Reconciliation completed    Assessment and Plan    ICD-10-CM    1. Hospital discharge follow-up  Z09       2. Post-operative state  Z98.890       3. Contact dermatitis, unspecified contact dermatitis type, unspecified trigger  L25.9       4. Insomnia, unspecified type  G47.00 traZODone (DESYREL) 50 mg Oral Tablet        Other transition actions (Optional) -: No pending tests or treatments. Keep appt with ortho as scheduled. Continue home health therapy as ordered.     Diflucan  given for iatrogenic yeast due to antibiotics and see if it will possibly treat her rash on her knee if a secondary yeast. Try Trazodone for sleep. Encouraged  patient to contact Dr. Azzo for pain medication if needed.     Return as scheduled or as needed.    Delana Favors, PA-C

## 2023-09-28 NOTE — Nursing Note (Signed)
 09/28/23 1013   Domestic Violence   Because we are aware of abuse and domestic violence today, we ask all patients: Are you being hurt, hit, or frightened by anyone at your home or in your life?  N   Basic Needs   Do you have any basic needs within your home that are not being met? (such as Food, Shelter, [employer] fast streamer, Tranportation, paying for bills and/or medications) N

## 2023-09-28 NOTE — Nursing Note (Signed)
 09/28/23 1014   PHQ 9 (follow up)   Little interest or pleasure in doing things. 0   Feeling down, depressed, or hopeless 3   Trouble falling or staying asleep, or sleeping too much. 3   Feeling tired or having little energy 2   Poor appetite or overeating 1   Feeling bad about yourself/ that you are a failure in the past 2 weeks? 0   Trouble concentrating on things in the past 2 weeks? 1   Moving/Speaking slowly or being fidgety or restless  in the past 2 weeks? 3   Thoughts that you would be better off DEAD, or of hurting yourself in some way. 0   If you checked off any problems, how difficult have these problems made it for you to do your work, take care of things at home, or get along with other people? Somewhat difficult   PHQ 9 Total 13   Interpretation of Total Score Moderate depression

## 2023-10-11 ENCOUNTER — Encounter (INDEPENDENT_AMBULATORY_CARE_PROVIDER_SITE_OTHER): Payer: Self-pay | Admitting: PHYSICIAN ASSISTANT

## 2023-10-11 ENCOUNTER — Ambulatory Visit: Payer: Self-pay | Attending: PHYSICIAN ASSISTANT | Admitting: PHYSICIAN ASSISTANT

## 2023-10-11 ENCOUNTER — Ambulatory Visit (HOSPITAL_COMMUNITY): Payer: Self-pay

## 2023-10-11 ENCOUNTER — Other Ambulatory Visit: Payer: Self-pay

## 2023-10-11 VITALS — BP 124/73 | HR 61 | Temp 97.7°F | Ht 66.0 in | Wt 160.0 lb

## 2023-10-11 DIAGNOSIS — G479 Sleep disorder, unspecified: Secondary | ICD-10-CM | POA: Insufficient documentation

## 2023-10-11 DIAGNOSIS — M25561 Pain in right knee: Secondary | ICD-10-CM | POA: Insufficient documentation

## 2023-10-11 DIAGNOSIS — Z96651 Presence of right artificial knee joint: Secondary | ICD-10-CM | POA: Insufficient documentation

## 2023-10-11 LAB — CBC WITH DIFF
BASOPHIL #: 0.1 10*3/uL (ref 0.00–0.10)
BASOPHIL %: 1 % (ref 0–1)
EOSINOPHIL #: 0.3 10*3/uL (ref 0.00–0.50)
EOSINOPHIL %: 4 % (ref 1–7)
HCT: 39.2 % (ref 31.2–41.9)
HGB: 13.2 g/dL (ref 10.9–14.3)
LYMPHOCYTE #: 1.6 10*3/uL (ref 1.10–3.10)
LYMPHOCYTE %: 23 % (ref 16–46)
MCH: 30.9 pg (ref 24.7–32.8)
MCHC: 33.7 g/dL (ref 32.3–35.6)
MCV: 91.9 fL (ref 75.5–95.3)
MONOCYTE #: 0.4 10*3/uL (ref 0.20–0.90)
MONOCYTE %: 6 % (ref 4–11)
MPV: 8.2 fL (ref 7.9–10.8)
NEUTROPHIL #: 4.6 10*3/uL (ref 1.90–8.20)
NEUTROPHIL %: 66 % (ref 43–77)
PLATELETS: 256 10*3/uL (ref 140–440)
RBC: 4.27 10*6/uL (ref 3.63–4.92)
RDW: 14.8 % (ref 12.3–17.7)
WBC: 7 10*3/uL (ref 3.8–11.8)

## 2023-10-11 LAB — BASIC METABOLIC PANEL
ANION GAP: 9 mmol/L (ref 4–13)
BUN/CREA RATIO: 16 (ref 6–22)
BUN: 13 mg/dL (ref 7–25)
CALCIUM: 9.5 mg/dL (ref 8.6–10.3)
CHLORIDE: 102 mmol/L (ref 98–107)
CO2 TOTAL: 30 mmol/L (ref 21–31)
CREATININE: 0.79 mg/dL (ref 0.60–1.30)
ESTIMATED GFR: 81 mL/min/{1.73_m2} (ref >59)
GLUCOSE: 89 mg/dL (ref 74–109)
OSMOLALITY, CALCULATED: 281 mosm/kg (ref 270–290)
POTASSIUM: 3.6 mmol/L (ref 3.5–5.1)
SODIUM: 141 mmol/L (ref 136–145)

## 2023-10-11 MED ORDER — KETOROLAC 30 MG/ML (1 ML) INJECTION SOLUTION
30.0000 mg | Freq: Once | INTRAMUSCULAR | Status: AC
Start: 2023-10-11 — End: 2023-10-11
  Administered 2023-10-11: 30 mg via INTRAMUSCULAR

## 2023-10-11 NOTE — Nursing Note (Signed)
 10/11/23 0902   Domestic Violence   Because we are aware of abuse and domestic violence today, we ask all patients: Are you being hurt, hit, or frightened by anyone at your home or in your life?  N   Basic Needs   Do you have any basic needs within your home that are not being met? (such as Food, Shelter, [employer] fast streamer, Tranportation, paying for bills and/or medications) N

## 2023-10-11 NOTE — Progress Notes (Signed)
 FAMILY MEDICINE, MEDICAL OFFICE BUILDING  [address]  [place] [address]  Operated by [facility]    Progress Note    Tonya Francis  1955/07/20  [id number]    Date of Service: 10/11/2023  9:30 AM EDT    Chief complaint:   Chief Complaint   Patient presents with    Follow Up     7 wk f/u - per pt she is still having a hard time sleeping and night time in general is terrible.     Labs Only       Subjective:     This is a case of a 68 y.o. year old female who comes in today for follow up. She had a right TKR, 09/12/23 with Dr. Azzo and came home 09/22/23. Pt.states she didn't receive the OT or PT at the hospital after rehab. Pt.states its been stressful  with the entire situation and she had allergic reaction with knee surgery. Pt. Saw NP with the allergic reaction.and she started on meds. Pt.continues to have trouble sleeping with the trazodone . She is having increase stress with knee recovery. The pain is stiff and hurts and doing the HEP. Pt.states she will see Dr. Azzo 10/13/23. No SOB, no chest pain, fever or chills, she is eating and drinking ok. No n.v.d. and bms are better.       Current Outpatient Medications   Medication Sig    aspirin 325 mg Oral Tablet Take 1 Tablet (325 mg total) by mouth Twice daily Post OP    atenoloL  (TENORMIN ) 25 mg Oral Tablet Take 1 Tablet (25 mg total) by mouth Daily for 90 days    BIOTIN ORAL Take 6,000 mcg by mouth Once a day    cefuroxime  (CEFTIN ) 500 mg Oral Tablet Take 1 Tablet (500 mg total) by mouth Twice daily (Patient not taking: Reported on 09/28/2023)    cephALEXin (KEFLEX) 750 mg Oral Capsule Take 1 Capsule (750 mg total) by mouth Twice daily (Patient not taking: Reported on 10/11/2023)    cholecalciferol, vitamin D3, 25 mcg (1,000 unit) Oral Tablet Take 2 Tablets (2,000 Units total) by mouth Daily    cyanocobalamin  (VITAMIN B12) 1,000 mcg/mL Injection Solution Inject 1 mL (1,000 mcg total) into the muscle Every 14 days Every 2 wks    esomeprazole   magnesium  (NEXIUM ) 40 mg Oral Capsule, Delayed Release(E.C.) Take 1 Capsule (40 mg total) by mouth Every morning before breakfast    fluticasone  propionate (FLONASE ) 50 mcg/actuation Nasal Spray, Suspension INSTILL 1 SPRAY INTO NOSTRIL(S) TWICE DAILY AS NEEDED    hydroCHLOROthiazide  (HYDRODIURIL ) 12.5 mg Oral Tablet Take 1 Tablet (12.5 mg total) by mouth Once a day for 30 days    hydroCHLOROthiazide  (MICROZIDE ) 12.5 mg Oral Capsule Take 1 Capsule (12.5 mg total) by mouth Once a day Indications: high blood pressure    Ibuprofen  (MOTRIN ) 800 mg Oral Tablet Take 1 Tablet (800 mg total) by mouth Twice per day as needed    Levocetirizine (XYZAL) 5 mg Oral Tablet Take 1 Tablet (5 mg total) by mouth Every evening    levothyroxine  (SYNTHROID) 75 mcg Oral Tablet Take 1 Tablet (75 mcg total) by mouth Once a day for 90 days    magnesium  Oxide 500 mg magnesium  Oral Tablet Take 1 Tablet (500 mg total) by mouth Once a day    multivit-min/folic ac/collagen (WOMEN'S MULTIVITAMIN COLLAGEN ORAL) Take 1 Scoop by mouth Once a day    mupirocin  (BACTROBAN ) 2 % Ointment Apply topically Twice daily  prenatal vitamin-iron-folate Tablet Take 1 Tablet by mouth Daily (Patient not taking: Reported on 09/28/2023)    sod sulf-pot chloride-mag sulf (SUTAB ) 1.479-0.188- 0.225 gram Oral Tablet Take 12 pills at 10-10:30 am. Take 12 pills at 6-6:30pm. Drink plenty of fluids all day/evening.    sod sulf-pot chloride-mag sulf (SUTAB ) 1.479-0.188- 0.225 gram Oral Tablet Take 12 tablets with specified amount of water the evening prior to colonoscopy, as directed on the package. Take an additional 12 tablets with specified amount of water the morning of the colonoscopy, as directed on the package. Complete all tablets and required water at least 2 hours prior to procedure.    traZODone  (DESYREL ) 50 mg Oral Tablet 1-2 tablets at bedtime (Patient not taking: Reported on 10/11/2023)       Objective:     BP 124/73   Pulse 61   Temp 36.5 C (97.7 F)  (Temporal)   Ht 1.676 m (5' 6)   Wt 72.6 kg (160 lb)   SpO2 95%   BMI 25.82 kg/m     Physical Exam  Vitals and nursing note reviewed.   Constitutional:       Appearance: Normal appearance. She is normal weight.   HENT:      Head: Normocephalic and atraumatic.   Cardiovascular:      Rate and Rhythm: Normal rate and regular rhythm.      Heart sounds: Normal heart sounds.   Pulmonary:      Effort: Pulmonary effort is normal.      Breath sounds: Normal breath sounds.   Musculoskeletal:         General: Swelling and deformity present.      Right lower leg: Edema present.      Comments: Right lower extremity with anterior knee area incision sites without erythema or warmthd/c noted. Mild patellar edema   Neurological:      General: No focal deficit present.      Mental Status: She is alert. Mental status is at baseline.   Psychiatric:      Comments: Pt.alert and oriented, anxious at times        Assessment & Plan  Right knee pain    Orders:    CBC/DIFF; Future    BASIC METABOLIC PANEL; Future    Status post right knee replacement  Continue with upcoming Dr. Azzo  appt.   Continue HEP       Sleep disturbance  Increase  Trazodone  50 mg 2 tablets at bedtime-pt.hasmeds fromlast visit.          Other orders    ketorolac  (TORADOL ) 30 mg/mL injection          The patient was given the opportunity to ask questions and those questions were answered to the patient's satisfaction. The patient was encouraged to call with any additional questions or concerns.    Follow up: Return in about 2 weeks (around 10/25/2023).    Gerardine Ammon, PA-C

## 2023-10-17 ENCOUNTER — Other Ambulatory Visit (HOSPITAL_COMMUNITY): Payer: Self-pay | Admitting: ORTHOPEDIC, SPORTS MEDICINE

## 2023-10-17 DIAGNOSIS — Z96652 Presence of left artificial knee joint: Secondary | ICD-10-CM

## 2023-10-18 ENCOUNTER — Ambulatory Visit (HOSPITAL_COMMUNITY)
Admission: RE | Admit: 2023-10-18 | Discharge: 2023-10-18 | Disposition: A | Discharge status: Still In Hospital | Payer: Self-pay | Source: Ambulatory Visit | Attending: ORTHOPEDIC, SPORTS MEDICINE | Admitting: ORTHOPEDIC, SPORTS MEDICINE

## 2023-10-18 ENCOUNTER — Encounter (HOSPITAL_COMMUNITY): Admission: RE | Payer: Self-pay | Source: Ambulatory Visit

## 2023-10-18 ENCOUNTER — Encounter (HOSPITAL_COMMUNITY): Payer: Self-pay

## 2023-10-18 ENCOUNTER — Ambulatory Visit: Admission: RE | Admit: 2023-10-18 | Source: Ambulatory Visit | Admitting: Surgery

## 2023-10-18 ENCOUNTER — Other Ambulatory Visit: Payer: Self-pay

## 2023-10-18 DIAGNOSIS — Z96652 Presence of left artificial knee joint: Secondary | ICD-10-CM | POA: Insufficient documentation

## 2023-10-18 SURGERY — COLONOSCOPY
Anesthesia: General

## 2023-10-18 NOTE — PT Evaluation (Signed)
 [facility]  Outpatient Physical Therapy  [address]  [place], [address]  [phone]  (Fax) [phone]      Physical Therapy Lower Extremity Evaluation    Date: 10/18/2023  Patient's Name: Tonya Francis  Date of Birth: 1956/02/12  Physical Therapy Evaluation     Evaluating Physical Therapist: SHAWNEE MOORE PT   PT diagnosis/Reason for Referral:  knee rehab post TKA  Next Scheduled Physician Appointment: 11/10/23  Allergies/Contraindications: LATEX               SUBJECTIVE  Date of onset: 09/12/23    Mechanism of injury: right TKA    Current Presentation: pt walks  with a limp AD     PLOF: walked with a limp pre op , limited tolerance to walking , genuvalgus    Previous episodes/treatments: home health therapy for maybe 5 sessions.  She was not prescribed .     Medications for this problem: IBUPROFEN    Living situation: lives alone, ramped entry , no stairs in home.   Diagnostic tests: PRE OP X RAY    Past Medical History:   Past Medical History:   Diagnosis Date    Allergic rhinitis     Arrhythmia     B12 deficiency     Cancer (CMS HCC)     basal cell skin cancer    Cardiac disease 07/26/2021    DDD (degenerative disc disease), lumbar     Depression     Disorder of thyroid      Dyslipidemia     Elevated LFTs     Esophageal reflux     Fluttering sensation of heart     Hearing loss     Hiatal hernia     History of acne rosacea     History of colonic polyps     History of kidney stones     HNP (herniated nucleus pulposus), lumbar     Hx of Cataract     Left    Hx of Osteoarthritis     Hypertension     Lung nodule     Palpitations     Pre-diabetes     Rosacea     Tremor     Vitamin D  deficiency      Past Surgical History:   Past Surgical History:   Procedure Laterality Date    ABDOMINAL SURGERY      BOWEL BLOCKAGE    COLONOSCOPY      2014, 2023    DENTAL SURGERY      HEMORROIDECTOMY      HX CATARACT REMOVAL      HX CHOLECYSTECTOMY      HX CYSTOSCOPY      BIOPSY OF UTERUS     HX EYE SURGERY      HX THYROID  BIOPSY      FNA Bx Benign 2021    HX TONSILLECTOMY      HX TUBAL LIGATION      KNEE ARTHROSCOPY Right     KNEE SURGERY      laproscopic    ORTHOPEDIC SURGERY Right     BROKEN RIGHT ARM     Patient goals: NORMALIZE FUNCTION    Occupation: retired    Next MD visit: 7/18    Pain location: ANTERIOR KNEE                    Pain description: DULL    Pain frequency:  INTERMITTENT, with end range motions ,  Pain rating: Now 1   Best 0   Worst 3 ( worse at night, with bed mobility)     Pain increases with: forced flexion  or extension           decreases with : COLD, REST, and elevation    Sensation: intact    Weakness: yes    Sleep affected: no    Subjective Functional Reports:    Sitting: WFL    Standing: WFL    Walking: PT ESTIMATES 50 YARDS     Lifting: WFL  for     Personal ADLs: INDEP     Patient-Specific Functional Score:  Problem Score   1. Walking  6   2. Housekeeping  6   12Total 12   Total score = sum of the activity scores/number of activities  Minimal detectable change (90% CI) for avg score = 2 points  Minimal detectable change (90% CI) for single activity score = 3 points  6           OBJECTIVE    A/PROM   right left   Knee Flexion 105/115  degrees  WNL   Knee Extension 15/5  degrees  WNL   Ankle DF WNL WNL   Ankle PF WNL WNL     ROM comments : 20  extension lag with LAQ and 10 degree lag with supine SLR      Strength comments:uneven wt distribution with sit to stand from standard chair height and use of UE counterbalance   Lowest seat ht for indep sit to stand without UE counterbalance  and even wt. Distribution = 22 inches.   Gait: NO ASSISTIVE DEVICE and pt exhibits vaulting gait with avoidance of knee flexion at toe off and avoidance of TKE at midstance phase of gait    Palpation: surgical scar is non tender and  non adhered. The pt exhibits min to no swelling .     Joint mobility:posterior capsule tightness noted    Posture: pt stands in slight knee flexion     Treatment  provided:evaluation, GT in // bars, home instructions    Access Code: [email or web address]  URL: [email or web address]  Date: 10/18/2023  Prepared by: Shawnee Moore    Exercises  - Seated Knee Extension Stretch With Overpressure: Foot Elevated  - 3 x daily - 7 x weekly - 1 sets - 4 reps - 20 sec hold  - Supine Hamstring Stretch with Strap  - 1 x daily - 7 x weekly - 3 sets - 10 reps  - Standing Bilateral Gastroc Stretch with Step  - 1 x daily - 7 x weekly - 3 sets - 10 reps  - Sit to Stand  - 2 x daily - 5 x weekly - 1 sets - 5-8 reps            ASSESSMENT    Impression: the pt has residual posterior knee tightness with TKE  but she has already met flexion ROM goals. She initially entered clinic with a vaulting gait but this is a habitual vs functional gait pattern. With verbal and visual cueing she was able to assume a much more normal gait pattern with no AD. She has minimal pain and swelling at 4 weeks post op TKA.     Rehab potential: GOOD  Short-Term Goals: 2-3 Weeks     -  Patient will demonstrate improved right  knee AROM to at least 0 -  to 110 degrees to aid in  transitions      -  Patient will demonstrate independence with progressive HEP to maximize gains from physical therapy.     -  Patient will report max 2/10 pain to aid in completion of  personal ADLs.    -  Patient will demonstrate improved strength of left  LE WFL for supine SLR without extension lag to aid in  gait stability     -  Patient will exhibit a normal gait pattern without AD.  Long-Term Goals: 4-6 Weeks     -  Patient will demonstrate improved left  knee strength of at least 4/5 to aid in functional transfers and stair negotiation.     -  Patient will demonstrate improved functional ability with Patient Specific Scale Score of at least 8 or more     -  Patient will demonstrate ability to ambulate up and down stairs with reciprocal  pattern and  use of rail to aid in community ambulation.     - sustained WB tolerance not limited by knee pain or  weakness to aid in community ambulation.     PLAN  Patient will attend 2 times per week x 4-6 weeks. Therapy may include, but is not limited to THERAPEUTIC EXERCISES, MYOFASCIAL/JOINT MOBILIZATION, ERGONOMIC TRAINING, TRANSFER/GAIT TRAINING, and HOME INSTRUCTIONS    Plan for next visit :  continue to work on gait mechanics, functional strengthening, TKE ROM        Evaluation complexity:   Personal factors impacting POC: LIVES ALONE   Co-morbidities impacting POC: PSYCHIATRIC DIAGNOSIS and CARDIAC DISEASE  Complexity of physical exam: INCLUDING MUSCULOSKELETAL SYSTEM (POSTURE, ROM, STRENGTH, HEIGHT/WEIGHT) and INCLUDING NEUROMUSCULAR EXAM (BALANCE, GAIT, LOCOMOTION, MOBILITY)   Clinical Presentation: STABLE   Evaluation Complexity: LOW-HISTORY 0, EXAMINATION 1-2, STABLE PRESENTATION      Total Session Time 45 and Timed code minutes 45         Intervention minutes: EVALUATION 20 minutes, THERAPEUTIC EXERCISE 15 minutes, and GAIT TRAINING [address]    Blossom, PT  10/18/2023,  1030

## 2023-10-20 ENCOUNTER — Ambulatory Visit
Admission: RE | Admit: 2023-10-20 | Discharge: 2023-10-20 | Disposition: A | Discharge status: Still In Hospital | Payer: Self-pay | Source: Ambulatory Visit | Attending: ORTHOPEDIC, SPORTS MEDICINE | Admitting: ORTHOPEDIC, SPORTS MEDICINE

## 2023-10-20 ENCOUNTER — Other Ambulatory Visit: Payer: Self-pay

## 2023-10-20 ENCOUNTER — Telehealth (INDEPENDENT_AMBULATORY_CARE_PROVIDER_SITE_OTHER): Payer: Self-pay | Admitting: PHYSICIAN ASSISTANT

## 2023-10-20 ENCOUNTER — Encounter (HOSPITAL_BASED_OUTPATIENT_CLINIC_OR_DEPARTMENT_OTHER): Payer: Self-pay | Admitting: PHYSICIAN ASSISTANT

## 2023-10-20 DIAGNOSIS — Z96651 Presence of right artificial knee joint: Secondary | ICD-10-CM

## 2023-10-20 DIAGNOSIS — Z471 Aftercare following joint replacement surgery: Secondary | ICD-10-CM

## 2023-10-20 DIAGNOSIS — G43909 Migraine, unspecified, not intractable, without status migrainosus: Secondary | ICD-10-CM

## 2023-10-20 DIAGNOSIS — Z7982 Long term (current) use of aspirin: Secondary | ICD-10-CM

## 2023-10-20 DIAGNOSIS — I10 Essential (primary) hypertension: Secondary | ICD-10-CM

## 2023-10-20 DIAGNOSIS — E039 Hypothyroidism, unspecified: Secondary | ICD-10-CM

## 2023-10-20 DIAGNOSIS — K219 Gastro-esophageal reflux disease without esophagitis: Secondary | ICD-10-CM

## 2023-10-20 DIAGNOSIS — K5900 Constipation, unspecified: Secondary | ICD-10-CM

## 2023-10-20 MED ORDER — MIRTAZAPINE 15 MG TABLET
ORAL_TABLET | ORAL | 0 refills | Status: DC
Start: 2023-10-20 — End: 2023-11-03

## 2023-10-20 NOTE — PT Treatment (Signed)
 [facility]  Outpatient Physical Therapy  [address]  [place], [address]  [phone]  (Fax) [phone]    Physical Therapy Treatment Note    Date: 10/20/2023  Patient's Name: Tonya Francis  Date of Birth: Dec 14, 1955  Physical Therapy Visit    Visit #/POC: 2   Authorization: 12 visits  POC Signed?: No  POC Ends: 11/29/23  Order Ends:    Next Progress Note Due: visit 8 or by 11/17/23    Evaluating Physical Therapist: SHAWNEE MOORE PT   PT diagnosis/Reason for Referral:  knee rehab post TKA  Next Scheduled Physician Appointment: 11/10/23  Allergies/Contraindications: LATEX      Subjective: Patient reports she is having difficulty sleeping, she was trying to take Trazodone  but it made her skin feel like it was crawling. She rates pain 2/10. She has some residual swelling in right ankle/foot. Having trouble with sit to stands from chair at home as all chairs are low and has not been able to find a way to elevate seat and have a stable surface.     Objective: Treatment delivered as noted below:     Measured ROM: not taken today 10/20/23  EXERCISE/ACTIVITY NAME REPETITIONS RESISTANCE COMPLETED THIS DOS   NuStep 6 minutes Level 1  Yes    Incline Stretch One minute large incline    Yes    Hamstring stretch on step  5 x 20 seconds    Yes    TKE against black band 10 x 5 sec   Yes    Glute/Quad set resisting pull into flexion TKE 10  Black manual pull Yes    Knee extension stretch with heel on towel roll   manual Yes    Patellar mobilization    Manual  Yes                DISCONTINUED ACTIVITIES                             Access Code: [email or web address]  URL: [email or web address]  Date: 10/20/2023  Prepared by: Romualdo Deed    Exercises  - Standing Terminal Knee Extension with Resistance  - 2 x daily - 7 x weekly - 3 sets - 10 reps    Assessment: Verbal and tactile cueing required for correct form with TKE in parallel bars and for glute/quad set. She has a diffuse rash on right  knee; advised her to contact surgeon's office and ask what they would like her to do for it. Progressed HEP to include standing TKE and gave patient black band for home.     Short-Term Goals: 2-3 Weeks                -  Patient will demonstrate improved right  knee AROM to at least 0 -  to 110 degrees to aid in  transitions                 -  Patient will demonstrate independence with progressive HEP to maximize gains from physical therapy.                -  Patient will report max 2/10 pain to aid in completion of  personal ADLs.               -  Patient will demonstrate improved strength of left  LE WFL for supine SLR without extension lag to aid in  gait  stability                -  Patient will exhibit a normal gait pattern without AD.  Long-Term Goals: 4-6 Weeks                -  Patient will demonstrate improved left  knee strength of at least 4/5 to aid in functional transfers and stair negotiation.                -  Patient will demonstrate improved functional ability with Patient Specific Scale Score of at least 8 or more                -  Patient will demonstrate ability to ambulate up and down stairs with reciprocal  pattern and  use of rail to aid in community ambulation.                - sustained WB tolerance not limited by knee pain or weakness to aid in community ambulation.     Plan: Assess response to today's session, progress as tolerated.     Total Session Time 45, Timed code minutes 45, and Untimed code minutes 0  THERAPEUTIC EXERCISE 45 minutes      Elizabethann Lackey, PTA  10/20/2023, 09:40

## 2023-10-20 NOTE — Telephone Encounter (Signed)
-----   Message from Gerardine Ammon, PA-C sent at 10/20/2023 12:46 PM EDT -----  Regarding: RE: medication  Stop Trazodone   Start Remeron 15 mg 1/2  to 1 tablet by mouth 1 hour prior bedtime #30/0  ----- Message -----  From: Edsel Folks, LPN  Sent: 3/74/7974   1:08 PM EDT  To: Kasandra Nelson-Jones, PA-C  Subject: medication                                       Pt. Stated she is having trouble sleeping and she is take Trazodone , Ariel put her on it. She is letting you know that trazodone  is not working for her, feels like her skin is crawling and she didn't get any sleep at all, she said she takes two pills  at night.

## 2023-10-20 NOTE — Telephone Encounter (Signed)
Spoke with pt. Voiced understanding.

## 2023-10-20 NOTE — Nursing Note (Signed)
 Home health Plan of care reviewed and signed by Gerardine Ammon, PA-C, see in scan media.

## 2023-10-24 ENCOUNTER — Other Ambulatory Visit: Payer: Self-pay

## 2023-10-24 ENCOUNTER — Ambulatory Visit (HOSPITAL_COMMUNITY)
Admission: RE | Admit: 2023-10-24 | Discharge: 2023-10-24 | Disposition: A | Discharge status: Still In Hospital | Payer: Self-pay | Source: Ambulatory Visit

## 2023-10-24 DIAGNOSIS — Z96652 Presence of left artificial knee joint: Secondary | ICD-10-CM | POA: Insufficient documentation

## 2023-10-24 NOTE — PT Treatment (Signed)
 [facility] Medicine [facility]  Outpatient Physical Therapy  [address]  [place], [address]  [phone]  (Fax) [phone]    Physical Therapy Treatment Note    Date: 10/24/2023  Patient's Name: Tonya Francis  Date of Birth: [birth year]  Physical Therapy Visit  Visit #/POC: 2   Authorization: 12 visits  POC Signed?: No  POC Ends: 11/29/23  Order Ends:    Next Progress Note Due: visit 8 or by 11/17/23     Evaluating Physical Therapist: SHAWNEE MOORE PT   PT diagnosis/Reason for Referral:  knee rehab post TKA  Next Scheduled Physician Appointment: 11/10/23  Allergies/Contraindications: LATEX        Subjective:  the pt reports continued skin dermatitis of operated leg but this was improved with medication prescribed by PCP.     Objective: Treatment delivered as noted below:      Measured ROM: not taken today 10/20/23  EXERCISE/ACTIVITY NAME REPETITIONS RESISTANCE COMPLETED THIS DOS   NuStep 6 minutes Level 1  no   Incline Stretch One minute large incline    no   Hamstring stretch on step  5 x 20 seconds    no   TKE against black band 10 x 5 sec  Heavy tubing  Yes    Dynamic FWB knee stability with contralat LE step forward , back and return  Resisting TB pull at knee,   Resisting TB pull at waist  Heavy tubing   Heavy tubing  Yes  yes   Glute/Quad set resisting pull into flexion TKE 10  Black manual pull Yes    Knee extension stretch with heel on towel roll   manual no   Patellar mobilization    Manual  no    LE press bilat   X10, then 2 x 10  60 lbs, then 70 lbs  yes   2 way resisted walk in // bars ,heel to toe retro steps  Heavy tubing   yes   Mobilization with movement to increase TKE at all phases of stance  Manual over pressure into TKE   YES      DISCONTINUED ACTIVITIES                                           Assessment:  The pt has a slightly vaulting gait due to stiffness with terminal knee extension. She tolerated FWB stability and mobility exercises without  c/o pain.     Short-Term  Goals: 2-3 Weeks                -  Patient will demonstrate improved right  knee AROM to at least 0 -  to 110 degrees to aid in  transitions                 -  Patient will demonstrate independence with progressive HEP to maximize gains from physical therapy.                -  Patient will report max 2/10 pain to aid in completion of  personal ADLs.               -  Patient will demonstrate improved strength of left  LE WFL for supine SLR without extension lag to aid in  gait stability                -  Patient will exhibit a normal gait pattern without AD.  Long-Term Goals: 4-6 Weeks                -  Patient will demonstrate improved left  knee strength of at least 4/5 to aid in functional transfers and stair negotiation.                -  Patient will demonstrate improved functional ability with Patient Specific Scale Score of at least 8 or more                -  Patient will demonstrate ability to ambulate up and down stairs with reciprocal  pattern and  use of rail to aid in community ambulation.                - sustained WB tolerance not limited by knee pain or weakness to aid in community ambulation.   Plan: continue with FWB stability , balance, proprioception exercises and TKE exercises    Total Session Time 34 and Timed code minutes 34  THERAPEUTIC EXERCISE 34 minutes      Shawnee Moore, PT  10/24/2023, [phone]

## 2023-10-26 ENCOUNTER — Ambulatory Visit (HOSPITAL_COMMUNITY)
Admission: RE | Admit: 2023-10-26 | Discharge: 2023-10-26 | Disposition: A | Discharge status: Still In Hospital | Payer: Self-pay | Source: Ambulatory Visit

## 2023-10-26 ENCOUNTER — Other Ambulatory Visit: Payer: Self-pay

## 2023-10-26 NOTE — PT Treatment (Signed)
 [facility] Medicine [facility]  Outpatient Physical Therapy  [address]  [place], [address]  [phone]  (Fax) [phone]    Physical Therapy Treatment Note    Date: 10/26/2023  Patient's Name: Tonya Francis  Date of Birth: May 02, 1955  Physical Therapy Visit    Visit #/POC: 4 of 12   Authorization: 12 visits  POC Signed?: No  POC Ends: 11/29/23  Order Ends:    Next Progress Note Due: visit 8 or by 11/17/23     Evaluating Physical Therapist: SHAWNEE MOORE PT   PT diagnosis/Reason for Referral:  knee rehab post TKA  Next Scheduled Physician Appointment: 11/10/23  Allergies/Contraindications: LATEX        Subjective:  Patient arrived with shoes that she had purchased that was recommended by another MD. Shoes were Sketchers that had a very high heel lift. Patient had not tried them yet, and these were recommended to help with her heel pain that she had been experiencing since surgery.      Objective: Observed shoe with patient, and found that the pitch of the angle of the heel to forefoot would be placing most of her weight on the ball of her forefoot, potentially creating more of an abnormal gait pattern while also shortening the heel cord. Patient did not feel comfortable at this time using these shoes.  Warm up on Nustep followed by exercises per flowsheet for VMO and functional strengthening.     Measured ROM: supine knee flexion 118  EXERCISE/ACTIVITY NAME REPETITIONS RESISTANCE COMPLETED THIS DOS   NuStep 6 minutes Level 1  yes   Incline Stretch One minute large incline    yes   Hamstring stretch on step  5 x 20 seconds    no   TKE against black band 10 x 5 sec  Heavy tubing  Yes    Dynamic FWB knee stability with contralat LE step forward , back and return  Resisting TB pull at knee,   Resisting TB pull at waist  Heavy tubing   Heavy tubing  Yes  yes   Glute/Quad set resisting pull into flexion TKE 10  Black manual pull Yes    Knee extension stretch with heel on towel roll   manual no    Patellar mobilization    Manual  no    LE press bilat   RLE press  2 x 10  2 x 10  80 lbs  50 lbs  Yes  Yes    2 way resisted walk in // bars ,heel to toe retro steps   Heavy tubing   yes   Mobilization with movement to increase TKE at all phases of stance  Manual over pressure into TKE    YES      DISCONTINUED ACTIVITIES                                           Assessment:  Good tolerance of Rx, and has met STG ROM of knee flexion.     Short-Term Goals: 2-3 Weeks                -  Patient will demonstrate improved right  knee AROM to at least 0 -  to 110 degrees to aid in  transitions  MET 7/3               -  Patient will demonstrate independence with progressive HEP to maximize gains from physical therapy.                -  Patient will report max 2/10 pain to aid in completion of  personal ADLs.               -  Patient will demonstrate improved strength of left  LE WFL for supine SLR without extension lag to aid in  gait stability                -  Patient will exhibit a normal gait pattern without AD.    Long-Term Goals: 4-6 Weeks                -  Patient will demonstrate improved left  knee strength of at least 4/5 to aid in functional transfers and stair negotiation.                -  Patient will demonstrate improved functional ability with Patient Specific Scale Score of at least 8 or more                -  Patient will demonstrate ability to ambulate up and down stairs with reciprocal  pattern and  use of rail to aid in community ambulation.                - sustained WB tolerance not limited by knee pain or weakness to aid in community ambulation.     Plan: continue with FWB stability , balance, proprioception exercises and TKE exercises    Total Session Time 45, Timed code minutes 45, and Untimed code minutes 0  THERAPEUTIC EXERCISE 45 minutes      Geni Search, PTA  10/26/2023, 14:05

## 2023-10-30 ENCOUNTER — Ambulatory Visit (HOSPITAL_COMMUNITY)
Admission: RE | Admit: 2023-10-30 | Discharge: 2023-10-30 | Disposition: A | Discharge status: Still In Hospital | Payer: Self-pay | Source: Ambulatory Visit

## 2023-10-30 ENCOUNTER — Other Ambulatory Visit: Payer: Self-pay

## 2023-10-30 NOTE — PT Treatment (Signed)
 [facility] Medicine [facility]  Outpatient Physical Therapy  [address]  [place], [address]  [phone]  (Fax) [phone]    Physical Therapy Treatment Note    Date: 10/30/2023  Patient's Name: Tonya Francis  Date of Birth: 1955-07-02  Physical Therapy Visit    Visit #/POC: 5 of 12   Authorization: 12 visits  POC Signed?: No  POC Ends: 11/29/23  Order Ends:    Next Progress Note Due: visit 8 or by 11/17/23     Evaluating Physical Therapist: SHAWNEE MOORE PT   PT diagnosis/Reason for Referral:  knee rehab post TKA RLE  Next Scheduled Physician Appointment: 11/10/23  Allergies/Contraindications: LATEX        Subjective: the pt reports being much better and thinks she will be ready  to do her HEP indep  after this week. She voices no c/o's. She performs communty ambulation without difficulty .  Objective:    PSFS = 9  Measured ROM: supine  TKE  = 0 with light over pressure and seated knee flexion =118 degrees   EXERCISE/ACTIVITY NAME REPETITIONS RESISTANCE COMPLETED THIS DOS   NuStep 6 minutes Level 1  no   Incline Stretch One minute large incline    no   Hamstring stretch on step  5 x 20 seconds    no   TKE against black band 10 x 5 sec  Heavy tubing  Yes    Dynamic FWB knee stability with contralat LE step forward , back and return  Resisting TB pull at knee,   Resisting TB pull at waist  Heavy tubing   Heavy tubing  Yes  no   Glute/Quad set resisting pull into flexion TKE 10  Black manual pull Yes    Knee extension stretch with heel on towel roll   Manual stretch , supine yes   Patellar mobilization    Manual  no    LE press bilat   RLE press  x20  2 x 10  80 lbs  60 lbs  Yes  Yes    2 way resisted walk in // bars ,heel to toe retro steps   Heavy tubing   yes   Mobilization with movement to increase TKE at all phases of stance  Manual over pressure into TKE     yes   SLS balance, static alternating L/R   10 sec  alternating  Using mirror yes      DISCONTINUED ACTIVITIES                                            Assessment:   the pt has poor SLS  balance bilat but does not exhibit a right vs left LE deficit with this activity. She reports her balance has always been bad. She has met all functional goals of therapy with exception of a slight extension lag with a supine SLR exercise . TKE ROM is limited by swelling of knee vs by posterior knee tightness . The pt has minimal swelling. She has a fluid gait with normal cadence  and symmetric stance and swing time noted.     Short-Term Goals: 2-3 Weeks                -  Patient will demonstrate improved right  knee AROM to at least 0 -  to 110 degrees to aid in  transitions   ( progressing)             -  Patient will demonstrate independence with progressive HEP to maximize gains from physical therapy. ( Met 10/30/23)                -  Patient will report max 2/10 pain to aid in completion of  personal ADLs. ( Met 10/30/23)                -  Patient will demonstrate improved strength of left  LE WFL for supine SLR without extension lag      to aid in  gait stability  ( not met, slight extension lag)                -  Patient will exhibit a normal gait pattern without AD. ( Met 10/30/23)     Long-Term Goals: 4-6 Weeks                -  Patient will demonstrate improved left  knee strength of at least 4/5 to aid in functional transfers and stair negotiation.  ( Met 10/30/23, pt can transfer sit to stand to sit from a 90/90 degrees seated position with min effort and no UE counterbalance. )               -  Patient will demonstrate improved functional ability with Patient Specific Scale Score of at least 8 or more  ( met 10/30/23)               -  Patient will demonstrate ability to ambulate up and down stairs with reciprocal  pattern and  use of rail to aid in community ambulation. ( Met 10/30/23)               - sustained WB tolerance not limited by knee pain or weakness to aid in community ambulation. (Met 10/30/23)      Plan: one more session and then discharge with HEP  per pt choice.     Total Session Time 38 and Timed code minutes 38  THERAPEUTIC EXERCISE 38 minutes      Shawnee Moore, PT  10/30/2023, 1600

## 2023-11-01 ENCOUNTER — Other Ambulatory Visit: Payer: Self-pay

## 2023-11-01 ENCOUNTER — Ambulatory Visit
Admission: RE | Admit: 2023-11-01 | Discharge: 2023-11-01 | Disposition: A | Discharge status: Still In Hospital | Payer: Self-pay | Source: Ambulatory Visit | Attending: ORTHOPEDIC, SPORTS MEDICINE | Admitting: ORTHOPEDIC, SPORTS MEDICINE

## 2023-11-01 NOTE — PT Treatment (Signed)
 [facility] Medicine [facility]  Outpatient Physical Therapy  [address]  [place], [address]  [phone]  (Fax) [phone]    Physical Therapy Treatment Note    Date: 11/01/2023  Patient's Name: Tonya Francis  Date of Birth: December 19, 1955  Physical Therapy Visit    Visit #/POC: 6 of 12   Authorization: 6 / 12 visits  POC Signed?: No  POC Ends: 11/29/23  Order Ends:    Next Progress Note Due: visit 8 or by 11/17/23     Evaluating Physical Therapist: SHAWNEE MOORE PT   PT diagnosis/Reason for Referral:  knee rehab post TKA RLE  Next Scheduled Physician Appointment: 11/10/23  Allergies/Contraindications: LATEX        Subjective: the pt reports  doing well after her last session .      Objective:  the pt's HEP was finalized on this date and handouts issued on all prescribed exercises.      From previous session : PSFS = 9  Measured ROM: supine  TKE  = 0 with light over pressure and seated knee flexion =118 degrees   EXERCISE/ACTIVITY NAME REPETITIONS RESISTANCE COMPLETED THIS DOS   NuStep 6 minutes Level 1  no   Incline Stretch/ OFF STEP EDGE One minute large incline    YES, REVIEWED    Hamstring stretch on step  5 x 20 seconds    no   TKE against black band 10 x 5 sec  Heavy tubing  NO   Dynamic FWB knee stability with contralat LE step forward , back and return  Resisting TB pull at knee,   Resisting TB pull at waist  Heavy tubing   Heavy tubing  NO  no   Glute/Quad set resisting pull into flexion TKE 10  Black manual pull NO   Knee extension stretch with heel on towel roll   Manual stretch , supine YES   Patellar mobilization    Manual  no    LE press bilat   RLE press  x10  2 x 10  80 lbs  60 lbs  Yes  NO    2 way resisted walk in // bars ,heel to toe retro steps  4 way lunge in // bars      Heavy tubing   NO  YES   Mobilization with movement to increase TKE at all phases of stance  Manual over pressure into TKE     yes   SLS balance, static alternating L/R   10 sec  alternating  Using mirror  NO, VERBAL REVIEW      DISCONTINUED ACTIVITIES                                          FWB knee flexion excursion is limited to 70 degrees by non operated knee pain and weakness.     Access Code: [email or web address]  URL: [email or web address]  Date: 11/01/2023  Prepared by: SHAWNEE MOORE    Exercises  - Single Leg Stance  - 1 x daily - 4 x weekly - 1 sets - 6 reps - 10s hold  - Step Up  - 1 x daily - 7 x weekly - 3 sets - 10 reps  - Forward Step Down  - 1 x daily - 7 x weekly - 3 sets - 10 reps  - Lateral Step Up  - 1 x daily -  7 x weekly - 3 sets - 10 reps  - 3-Way Lunge on Slider  - 1 x daily - 7 x weekly - 3 sets - 10 reps  - Mini Lunge  - 1 x daily - 4 x weekly - 3 sets - 10 reps  - Gastroc Stretch on Step  - 1 x daily - 7 x weekly - 1 sets - 5 reps - 30s hold    Assessment:   the pt has slight extension lag with active extension but had full passive extension with moderate overpressure.  She exhibits good gait mechanics and has met all functional goals . She feels ready for discharge and indicates she will be diligent with her HEP . Note, the pt's non operated knee is more   symptomatic than  her operated knee.     Short-Term Goals: 2-3 Weeks                -  Patient will demonstrate improved right  knee AROM to at least 0 -  to 110 degrees to aid in  transitions   ( progressing, )             -  Patient will demonstrate independence with progressive HEP to maximize gains from physical therapy. ( Met 10/30/23)                -  Patient will report max 2/10 pain to aid in completion of  personal ADLs. ( Met 10/30/23)                -  Patient will demonstrate improved strength of left  LE WFL for supine SLR without extension lag      to aid in  gait stability  ( not met, slight extension lag)                -  Patient will exhibit a normal gait pattern without AD. ( Met 10/30/23)     Long-Term Goals: 4-6 Weeks                -  Patient will demonstrate improved left  knee strength of at least 4/5 to aid in functional  transfers and stair negotiation.  ( Met 10/30/23, pt can transfer sit to stand to sit from a 90/90 degrees seated position with min effort and no UE counterbalance. )               -  Patient will demonstrate improved functional ability with Patient Specific Scale Score of at least 8 or more  ( met 10/30/23)               -  Patient will demonstrate ability to ambulate up and down stairs with reciprocal  pattern and  use of rail to aid in community ambulation. ( Met 10/30/23)               - sustained WB tolerance not limited by knee pain or weakness to aid in community ambulation. (Met 10/30/23)    Plan:  discharge with HEP     Total Session Time 36 and Timed code minutes 36  THERAPEUTIC EXERCISE 36 minutes      Shawnee Moore, PT  11/01/2023, 1125

## 2023-11-03 ENCOUNTER — Encounter (INDEPENDENT_AMBULATORY_CARE_PROVIDER_SITE_OTHER): Payer: Self-pay | Admitting: PHYSICIAN ASSISTANT

## 2023-11-03 ENCOUNTER — Other Ambulatory Visit: Payer: Self-pay

## 2023-11-03 ENCOUNTER — Ambulatory Visit: Payer: Self-pay | Attending: PHYSICIAN ASSISTANT | Admitting: PHYSICIAN ASSISTANT

## 2023-11-03 ENCOUNTER — Ambulatory Visit (HOSPITAL_COMMUNITY): Payer: Self-pay

## 2023-11-03 VITALS — BP 127/80 | HR 64 | Temp 97.0°F | Ht 66.0 in | Wt 160.0 lb

## 2023-11-03 DIAGNOSIS — F419 Anxiety disorder, unspecified: Secondary | ICD-10-CM | POA: Insufficient documentation

## 2023-11-03 DIAGNOSIS — G4700 Insomnia, unspecified: Secondary | ICD-10-CM | POA: Insufficient documentation

## 2023-11-03 DIAGNOSIS — Z96651 Presence of right artificial knee joint: Secondary | ICD-10-CM | POA: Insufficient documentation

## 2023-11-03 DIAGNOSIS — L259 Unspecified contact dermatitis, unspecified cause: Secondary | ICD-10-CM | POA: Insufficient documentation

## 2023-11-03 LAB — DRUG SCREEN, NO CONFIRMATION, URINE
AMPHETAMINES URINE: NEGATIVE
BARBITURATES URINE: NEGATIVE
BENZODIAZEPINES URINE: POSITIVE — AB
BUPRENORPHINE URINE: NEGATIVE
CANNABINOIDS URINE: NEGATIVE
COCAINE METABOLITES URINE: NEGATIVE
FENTANYL, URINE: NEGATIVE
METHADONE URINE: NEGATIVE
OPIATES URINE: NEGATIVE
OXYCODONE URINE: NEGATIVE
PCP URINE: NEGATIVE

## 2023-11-03 MED ORDER — HYDROCORTISONE 2.5 % TOPICAL CREAM
TOPICAL_CREAM | Freq: Two times a day (BID) | CUTANEOUS | 1 refills | Status: AC
Start: 2023-11-03 — End: ?

## 2023-11-03 MED ORDER — LORAZEPAM 0.5 MG TABLET
ORAL_TABLET | ORAL | 0 refills | Status: DC
Start: 2023-11-03 — End: 2023-11-15

## 2023-11-03 NOTE — Nursing Note (Signed)
 11/03/23 9063   Domestic Violence   Because we are aware of abuse and domestic violence today, we ask all patients: Are you being hurt, hit, or frightened by anyone at your home or in your life?  N   Basic Needs   Do you have any basic needs within your home that are not being met? (such as Food, Shelter, [employer] fast streamer, Tranportation, paying for bills and/or medications) N

## 2023-11-03 NOTE — Progress Notes (Signed)
 [facility]ILY [facility], MEDICAL OFFICE BUILDING  [address]  [place] [address]  Operated by [facility]    Progress Note    Tonya Francis  24-Jul-1955  [id number]    Date of Service: 11/03/2023 10:00 AM EDT    Chief complaint:   Chief Complaint   Patient presents with    Follow Up     Per pt she states that her last day of PT was Wednesday, she states some swelling and having a hard time straightening it out. Also had to start another pack of steroids 2 weeks ago due to another breakout.        Subjective:     This is a case of a 68 y.o. year old female who comes in today for follow upon her right knee replacement in 09/2023. Pts,saw Dr.Azzo's np last Friday and she's finished PT Wednesday. Pt.had allergic reaction to staples possible and the hydrocortisone  cream helped with area on patella and side. No falls no injuries. No walker use.   Cream used with relief and then returned and no other areas with rash. Pt.reschedule her colonoscopy 11/2023 with Dr.Duremedes. Pt. Reports no SOB, no chest pain, no cough, no fever, no chills. Pt.states she can not sleep with the increase in trazodone  and unable tolerate the Remeron  she's been on X2 medrol  dose pack for rash and just finished up. She states it made her insomnia worse and shaky jumpy can not sleep      Current Outpatient Medications   Medication Sig    aspirin 325 mg Oral Tablet Take 1 Tablet (325 mg total) by mouth Twice daily Post OP (Patient not taking: Reported on 11/03/2023)    atenoloL  (TENORMIN ) 25 mg Oral Tablet Take 1 Tablet (25 mg total) by mouth Daily for 90 days    cholecalciferol, vitamin D3, 25 mcg (1,000 unit) Oral Tablet Take 2 Tablets (2,000 Units total) by mouth Daily    cyanocobalamin  (VITAMIN B12) 1,000 mcg/mL Injection Solution Inject 1 mL (1,000 mcg total) into the muscle Every 14 days Every 2 wks    esomeprazole  magnesium  (NEXIUM ) 40 mg Oral Capsule, Delayed Release(E.C.) Take 1 Capsule (40 mg total) by mouth Every  morning before breakfast    fluticasone  propionate (FLONASE ) 50 mcg/actuation Nasal Spray, Suspension INSTILL 1 SPRAY INTO NOSTRIL(S) TWICE DAILY AS NEEDED    hydroCHLOROthiazide  (HYDRODIURIL ) 12.5 mg Oral Tablet Take 1 Tablet (12.5 mg total) by mouth Once a day for 30 days    hydroCHLOROthiazide  (MICROZIDE ) 12.5 mg Oral Capsule Take 1 Capsule (12.5 mg total) by mouth Once a day Indications: high blood pressure    hydrocortisone  2.5 % Cream Apply topically Twice daily    Ibuprofen  (MOTRIN ) 800 mg Oral Tablet Take 1 Tablet (800 mg total) by mouth Twice per day as needed    Levocetirizine (XYZAL) 5 mg Oral Tablet Take 1 Tablet (5 mg total) by mouth Every evening    levothyroxine  (SYNTHROID) 75 mcg Oral Tablet Take 1 Tablet (75 mcg total) by mouth Once a day for 90 days    magnesium  Oxide 500 mg magnesium  Oral Tablet Take 1 Tablet (500 mg total) by mouth Once a day    multivit-min/folic ac/collagen (WOMEN'S MULTIVITAMIN COLLAGEN ORAL) Take 1 Scoop by mouth Once a day    prenatal vitamin-iron-folate Tablet Take 1 Tablet by mouth Daily    sod sulf-pot chloride-mag sulf (SUTAB ) 1.479-0.188- 0.225 gram Oral Tablet Take 12 pills at 10-10:30 am. Take 12 pills at 6-6:30pm. Drink plenty of  fluids all day/evening.    sod sulf-pot chloride-mag sulf (SUTAB ) 1.479-0.188- 0.225 gram Oral Tablet Take 12 tablets with specified amount of water the evening prior to colonoscopy, as directed on the package. Take an additional 12 tablets with specified amount of water the morning of the colonoscopy, as directed on the package. Complete all tablets and required water at least 2 hours prior to procedure.       Objective:     BP 127/80   Pulse 64   Temp 36.1 C (97 F) (Temporal)   Ht 1.676 m (5' 6)   Wt 72.6 kg (160 lb)   SpO2 97%   BMI 25.82 kg/m      Physical Exam  Vitals and nursing note reviewed.   HENT:      Head: Normocephalic and atraumatic.   Eyes:      Extraocular Movements: Extraocular movements intact.       Conjunctiva/sclera: Conjunctivae normal.   Cardiovascular:      Rate and Rhythm: Normal rate and regular rhythm.      Heart sounds: Normal heart sounds.   Pulmonary:      Effort: Pulmonary effort is normal.      Breath sounds: Normal breath sounds.   Musculoskeletal:         General: Swelling present.      Cervical back: Normal range of motion and neck supple.      Comments: Decreased ROM with flexion, and patella area with decreased edema and mild  erythema knee with dryness, and no open areas.    Neurological:      Mental Status: She is alert.      Gait: Gait normal.         Assessment & Plan  Status post right knee replacement  Continue follow with orthopedic       Contact dermatitis  Hydrocortisone  cream 2% apply bid prn 22 gm/1       Insomnia  Ativan  0.5 mg 1/2 to 1 tab 1 hour prior to bedtime #20/0  Side effects discussed with pt.  Controlled med agreement discussed with pt.  CSAPP monitor reviewed  Urine drug screen  Orders:    DRUG SCREEN, NO CONFIRMATION, URINE; Future    Anxiety            Other orders    hydrocortisone  2.5 % Cream; Apply topically Twice daily    LORazepam  (ATIVAN ) 0.5 mg Oral Tablet; Take 1/2 to 1 tablet by mouth 1 hour prior to bedtime as needed          The patient was given the opportunity to ask questions and those questions were answered to the patient's satisfaction. The patient was encouraged to call with any additional questions or concerns.    Follow up: Return in about 1 month (around 12/04/2023), or 30 minute.    Gerardine Ammon, PA-C

## 2023-11-07 ENCOUNTER — Ambulatory Visit (INDEPENDENT_AMBULATORY_CARE_PROVIDER_SITE_OTHER): Payer: Self-pay | Admitting: PHYSICIAN ASSISTANT

## 2023-11-07 ENCOUNTER — Other Ambulatory Visit: Payer: Self-pay

## 2023-11-07 ENCOUNTER — Ambulatory Visit
Admission: RE | Admit: 2023-11-07 | Discharge: 2023-11-07 | Disposition: A | Discharge status: Home / Self Care | Source: Ambulatory Visit | Attending: PHYSICIAN ASSISTANT | Admitting: PHYSICIAN ASSISTANT

## 2023-11-07 DIAGNOSIS — N888 Other specified noninflammatory disorders of cervix uteri: Secondary | ICD-10-CM | POA: Insufficient documentation

## 2023-11-07 MED ORDER — GADOBUTROL 10 MMOL/10 ML (1 MMOL/ML) INTRAVENOUS SOLUTION
10.0000 mL | INTRAVENOUS | Status: AC
Start: 2023-11-07 — End: 2023-11-07
  Administered 2023-11-07: 7 mL via INTRAVENOUS

## 2023-11-08 ENCOUNTER — Other Ambulatory Visit (INDEPENDENT_AMBULATORY_CARE_PROVIDER_SITE_OTHER): Payer: Self-pay | Admitting: PHYSICIAN ASSISTANT

## 2023-11-10 MED ORDER — HYDROXYZINE HCL 10 MG TABLET
10.0000 mg | ORAL_TABLET | Freq: Three times a day (TID) | ORAL | 0 refills | Status: DC | PRN
Start: 2023-11-10 — End: 2024-02-07

## 2023-11-12 ENCOUNTER — Other Ambulatory Visit (INDEPENDENT_AMBULATORY_CARE_PROVIDER_SITE_OTHER): Payer: Self-pay | Admitting: PHYSICIAN ASSISTANT

## 2023-11-15 ENCOUNTER — Other Ambulatory Visit: Payer: Self-pay

## 2023-11-15 ENCOUNTER — Ambulatory Visit: Payer: Self-pay | Attending: PHYSICIAN ASSISTANT | Admitting: PHYSICIAN ASSISTANT

## 2023-11-15 ENCOUNTER — Encounter (INDEPENDENT_AMBULATORY_CARE_PROVIDER_SITE_OTHER): Payer: Self-pay | Admitting: PHYSICIAN ASSISTANT

## 2023-11-15 ENCOUNTER — Encounter (INDEPENDENT_AMBULATORY_CARE_PROVIDER_SITE_OTHER): Payer: Self-pay

## 2023-11-15 VITALS — BP 123/78 | HR 63 | Temp 97.8°F | Ht 66.0 in | Wt 160.0 lb

## 2023-11-15 DIAGNOSIS — T7840XA Allergy, unspecified, initial encounter: Secondary | ICD-10-CM | POA: Insufficient documentation

## 2023-11-15 DIAGNOSIS — L259 Unspecified contact dermatitis, unspecified cause: Secondary | ICD-10-CM | POA: Insufficient documentation

## 2023-11-15 NOTE — Nursing Note (Signed)
 11/15/23 1551   Domestic Violence   Because we are aware of abuse and domestic violence today, we ask all patients: Are you being hurt, hit, or frightened by anyone at your home or in your life?  N   Basic Needs   Do you have any basic needs within your home that are not being met? (such as Food, Shelter, [employer] fast streamer, Tranportation, paying for bills and/or medications) N

## 2023-11-15 NOTE — Progress Notes (Signed)
 [facility] MEDICAL OFFICE [facility]  FAMILY MEDICINE, MEDICAL OFFICE BUILDING  [address]  [place] [address]  [phone]   Tonya Francis  Sep 12, 1955  [id number]    Date of Service: 11/15/2023  Chief complaint:   Chief Complaint   Patient presents with    ED Follow-up     Allergic Reaction - ED 07/13. Per pt she states she did not take the atarax  due to predniSONE and benadryl given to her in the ED. She has used some cream that has helped.      Subjective:   Tonya Francis is a 68 y.o. female and presents today for follow-up from ER visit for allergic skin reaction. Rash began in the night after taking Ativan . She reports the ativan  was helpful with sleep but she broke out after taking. She is still not sleeping well.   They gave her prednisone and benadryl at the er which helped. They also gave her epipen. Denies shortness of breath, chest pain, heart flutters, facial swelling.     Past Medical History:   Diagnosis Date    Allergic rhinitis     Arrhythmia     B12 deficiency     Cancer (CMS HCC)     basal cell skin cancer    Cardiac disease 07/26/2021    DDD (degenerative disc disease), lumbar     Depression     Disorder of thyroid      Dyslipidemia     Elevated LFTs     Esophageal reflux     Fluttering sensation of heart     Hearing loss     Hiatal hernia     History of acne rosacea     History of colonic polyps     History of kidney stones     HNP (herniated nucleus pulposus), lumbar     Hx of Cataract     Left    Hx of Osteoarthritis     Hypertension     Lung nodule     Palpitations     Pre-diabetes     Rosacea     Tremor     Vitamin D  deficiency          Past Surgical History:   Procedure Laterality Date    ABDOMINAL SURGERY      BOWEL BLOCKAGE    COLONOSCOPY      2014, 2023    DENTAL SURGERY      HEMORROIDECTOMY      HX CATARACT REMOVAL      HX CHOLECYSTECTOMY      HX CYSTOSCOPY      BIOPSY OF UTERUS    HX EYE SURGERY      HX THYROID  BIOPSY      FNA Bx Benign 2021    HX TONSILLECTOMY       HX TUBAL LIGATION      KNEE ARTHROSCOPY Right     KNEE SURGERY      laproscopic    ORTHOPEDIC SURGERY Right     BROKEN RIGHT ARM         Current Outpatient Medications   Medication Sig Dispense Refill    atenoloL  (TENORMIN ) 25 mg Oral Tablet Take 1 Tablet (25 mg total) by mouth Daily for 90 days 90 Tablet 0    cholecalciferol, vitamin D3, 25 mcg (1,000 unit) Oral Tablet Take 2 Tablets (2,000 Units total) by mouth Daily      cyanocobalamin  (VITAMIN B12) 1,000 mcg/mL Injection Solution INJECT 1 ML (1,000 MCG  TOTAL) INTO THE MUSCLE EVERY 14 DAYS EVERY 2 WKS 6 mL 1    EPINEPHrine 0.3 mg/0.3 mL Injection Auto-Injector 0.3 ML ONE TIME ONLY BY INTRAMUSCULAR ROUTE FOR 1 DOSE.      esomeprazole  magnesium  (NEXIUM ) 40 mg Oral Capsule, Delayed Release(E.C.) Take 1 Capsule (40 mg total) by mouth Every morning before breakfast 90 Capsule 1    fluticasone  propionate (FLONASE ) 50 mcg/actuation Nasal Spray, Suspension INSTILL 1 SPRAY INTO NOSTRIL(S) TWICE DAILY AS NEEDED 50 g 1    hydroCHLOROthiazide  (HYDRODIURIL ) 12.5 mg Oral Tablet Take 1 Tablet (12.5 mg total) by mouth Once a day for 30 days 30 Tablet 0    hydroCHLOROthiazide  (MICROZIDE ) 12.5 mg Oral Capsule Take 1 Capsule (12.5 mg total) by mouth Once a day Indications: high blood pressure 90 Capsule 0    hydrocortisone  2.5 % Cream Apply topically Twice daily 28 g 1    hydrOXYzine  HCL (ATARAX ) 10 mg Oral Tablet Take 1 Tablet (10 mg total) by mouth Three times a day as needed for Itching (Patient not taking: Reported on 11/15/2023) 90 Tablet 0    Ibuprofen  (MOTRIN ) 800 mg Oral Tablet Take 1 Tablet (800 mg total) by mouth Twice per day as needed 90 Tablet 0    Levocetirizine (XYZAL) 5 mg Oral Tablet Take 1 Tablet (5 mg total) by mouth Every evening 90 Tablet 1    levothyroxine  (SYNTHROID) 75 mcg Oral Tablet Take 1 Tablet (75 mcg total) by mouth Once a day for 90 days 90 Tablet 0    magnesium  Oxide 500 mg magnesium  Oral Tablet Take 1 Tablet (500 mg total) by mouth Once a day 90  Tablet 0    multivit-min/folic ac/collagen (WOMEN'S MULTIVITAMIN COLLAGEN ORAL) Take 1 Scoop by mouth Once a day      prenatal vitamin-iron-folate Tablet Take 1 Tablet by mouth Daily      sod sulf-pot chloride-mag sulf (SUTAB ) 1.479-0.188- 0.225 gram Oral Tablet Take 12 pills at 10-10:30 am. Take 12 pills at 6-6:30pm. Drink plenty of fluids all day/evening. 24 Tablet 0    sod sulf-pot chloride-mag sulf (SUTAB ) 1.479-0.188- 0.225 gram Oral Tablet Take 12 tablets with specified amount of water the evening prior to colonoscopy, as directed on the package. Take an additional 12 tablets with specified amount of water the morning of the colonoscopy, as directed on the package. Complete all tablets and required water at least 2 hours prior to procedure. 24 Tablet 0     No current facility-administered medications for this visit.     Allergies[1]  Review of Systems:  Any pertinent Review of Systems as addressed in the HPI above.    Objective:   BP 123/78   Pulse 63   Temp 36.6 C (97.8 F) (Temporal)   Ht 1.676 m (5' 6)   Wt 72.6 kg (160 lb)   SpO2 98%   BMI 25.82 kg/m     Constitutional: No acute distress. Alert and oriented.  Head: Normocephalic, atraumatic.  Eyes: Clear. PERRL. EOMI.  Throat: Clear, no erythema or drainge.  Lungs: Clear to auscultation bilaterally. No wheezes, rhonchi, or rales.  Heart: Regular rate and rhythm.   Abdomen: Soft, non-tender, nondistended. BS active.  Extremities: Intact x 4. No edema.   Neuro: CN 2-12 grossly intact.   Psych: Mood and affect appropriate and congruent.    Assessment & Plan  Allergic reaction  Reaction improved with treatment given in the ER. I have added ativan  to allergy list. I added the prescribed epi pen to  her med list. She had not tried the hydroxyzine  but I counseled she could try this for sleep but she doesn't know if she will at this time secondary to concern for reaction. Also encouraged patient to contact ortho if persistent rashes to the knee or any  unexplained recurrence of her full body rash.           Medication summary was discussed with the patient to verify compliance and understanding. Medication safety was discussed. A good faith effort was made to reconcile the patient's medications.     The patient was given the opportunity to ask questions and those questions were answered to the patient's satisfaction. The patient was encouraged to call with any additional questions or concerns. More than 50% of the visit was spent counseling and coordinating care.    No orders of the defined types were placed in this encounter.                 Return as scheduled or as needed.  [address], PA-C 11/15/2023, 16:23         [1]   Allergies  Allergen Reactions    Latex Itching    Sulfa (Sulfonamides) Swelling and Angioedema    Latex Rash    Lorazepam 

## 2023-12-11 ENCOUNTER — Encounter (INDEPENDENT_AMBULATORY_CARE_PROVIDER_SITE_OTHER): Payer: Self-pay | Admitting: PHYSICIAN ASSISTANT

## 2023-12-11 ENCOUNTER — Other Ambulatory Visit: Payer: Self-pay

## 2023-12-11 ENCOUNTER — Ambulatory Visit: Payer: Self-pay | Attending: PHYSICIAN ASSISTANT | Admitting: PHYSICIAN ASSISTANT

## 2023-12-11 VITALS — BP 132/87 | HR 63 | Ht 66.0 in | Wt 164.1 lb

## 2023-12-11 DIAGNOSIS — R002 Palpitations: Secondary | ICD-10-CM | POA: Insufficient documentation

## 2023-12-11 DIAGNOSIS — R9431 Abnormal electrocardiogram [ECG] [EKG]: Secondary | ICD-10-CM

## 2023-12-11 DIAGNOSIS — I10 Essential (primary) hypertension: Secondary | ICD-10-CM | POA: Insufficient documentation

## 2023-12-11 DIAGNOSIS — I110 Hypertensive heart disease with heart failure: Secondary | ICD-10-CM

## 2023-12-11 NOTE — Progress Notes (Signed)
 [facility] Cardiology [facility]     Tonya Francis, 68 y.o. female  Date of Service: 12/11/2023  Date of Birth:  [birth year]  PCP:  Gerardine Ammon, PA-C  Chief Complaint   Patient presents with    Hypertension    Palpitations    Follow Up     Yearly        HPI:    The patient has no history of CAD.  She has history of hypertension, hypothyroidism, renal stones and family history of coronary artery disease.  She has issues with palpitations and takes atenolol . She bleeds easy on ASA daily and takes QOD.     12/22/21 The patient is here for yearly f/u.  She reports no specific chest pain or so breath, but she is been having episodes of intense palpitations, usually after she gets out of the shower.  She will become very diaphoretic and weak and symptoms will last seconds to several minutes and then resolve.  She also notices these episodes under high stress situations.  She admits that she is a very anxious person.  She is also concerned because she has family history odd cardiac anomalies.     12/14/2022 The patient is here for yearly f/u for palpitations.  She did have echocardiogram after last visit on 01/06/23 which showed EF 60% with mildly dilated left atrium and no significant valvular disease.  He denies any chest pains or shortness of breath.  She had some issues recently with elevated blood pressure and palpitations, but she was drinking tea daily as well as this causing her trouble.  She also had some severe dizziness and fall ENT and had a tilt-table test.  Once she stopped drinking the tea, her symptoms improved.  Her PCP did start her on a diuretic for blood pressure but she forgot name.  Blood pressures have been stable since then.    12/11/23 Patient here for annual follow up. She reports that she has been doing well. She will have palpitations once every couple of months now. Feels that they are mostly associated with higher stress levels. No syncopal episodes. Denies any chest pain or  shortness of breath. Tolerating medications well.     EKG: Sinus rhythm at 62. Left axis. No acute ischemic changes.   LAB:     Lab Results   Component Value Date    CHOLESTEROL 158 08/02/2023    HDLCHOL 50 08/02/2023    LDLCHOL 76 08/02/2023    TRIG 159 (H) 08/02/2023       Lab Results   Component Value Date    HA1C 5.4 01/03/2022      Last BMP  (Last result in the past 2 years)        Na   K   Cl   CO2   BUN   Cr   Calcium   Glucose   Glucose-Fasting        10/11/23 1051 141   3.6   102   30   13   0.79   9.5   89                  Past Medical History:   Diagnosis Date    Allergic rhinitis     Arrhythmia     B12 deficiency     Cancer (CMS HCC)     basal cell skin cancer    Cardiac disease 07/26/2021    DDD (degenerative disc disease), lumbar     Depression  Disorder of thyroid      Dyslipidemia     Elevated LFTs     Esophageal reflux     Fluttering sensation of heart     Hearing loss     Hiatal hernia     History of acne rosacea     History of colonic polyps     History of kidney stones     HNP (herniated nucleus pulposus), lumbar     Hx of Cataract     Left    Hx of Osteoarthritis     Hypertension     Lung nodule     Palpitations     Pre-diabetes     Rosacea     Tremor     Vitamin D  deficiency        Past Surgical History:   Procedure Laterality Date    ABDOMINAL SURGERY      BOWEL BLOCKAGE    COLONOSCOPY      2014, 2023    DENTAL SURGERY      HEMORROIDECTOMY      HX CATARACT REMOVAL      HX CHOLECYSTECTOMY      HX CYSTOSCOPY      BIOPSY OF UTERUS    HX EYE SURGERY      HX THYROID  BIOPSY      FNA Bx Benign 2021    HX TONSILLECTOMY      HX TUBAL LIGATION      KNEE ARTHROSCOPY Right     KNEE SURGERY      laproscopic    ORTHOPEDIC SURGERY Right     BROKEN RIGHT ARM       Current Outpatient Medications   Medication Sig    atenoloL  (TENORMIN ) 25 mg Oral Tablet Take 1 Tablet (25 mg total) by mouth Daily for 90 days    cholecalciferol, vitamin D3, 25 mcg (1,000 unit) Oral Tablet Take 2 Tablets (2,000 Units total) by  mouth Daily    cyanocobalamin  (VITAMIN B12) 1,000 mcg/mL Injection Solution INJECT 1 ML (1,000 MCG TOTAL) INTO THE MUSCLE EVERY 14 DAYS EVERY 2 WKS    EPINEPHrine 0.3 mg/0.3 mL Injection Auto-Injector 0.3 ML ONE TIME ONLY BY INTRAMUSCULAR ROUTE FOR 1 DOSE.    esomeprazole  magnesium  (NEXIUM ) 40 mg Oral Capsule, Delayed Release(E.C.) Take 1 Capsule (40 mg total) by mouth Every morning before breakfast    fluticasone  propionate (FLONASE ) 50 mcg/actuation Nasal Spray, Suspension INSTILL 1 SPRAY INTO NOSTRIL(S) TWICE DAILY AS NEEDED    hydroCHLOROthiazide  (HYDRODIURIL ) 12.5 mg Oral Tablet Take 1 Tablet (12.5 mg total) by mouth Once a day for 30 days    hydroCHLOROthiazide  (MICROZIDE ) 12.5 mg Oral Capsule Take 1 Capsule (12.5 mg total) by mouth Once a day Indications: high blood pressure    hydrocortisone  2.5 % Cream Apply topically Twice daily    hydrOXYzine  HCL (ATARAX ) 10 mg Oral Tablet Take 1 Tablet (10 mg total) by mouth Three times a day as needed for Itching (Patient not taking: Reported on 12/11/2023)    Ibuprofen  (MOTRIN ) 800 mg Oral Tablet Take 1 Tablet (800 mg total) by mouth Twice per day as needed    Levocetirizine (XYZAL) 5 mg Oral Tablet Take 1 Tablet (5 mg total) by mouth Every evening    levothyroxine  (SYNTHROID) 75 mcg Oral Tablet Take 1 Tablet (75 mcg total) by mouth Once a day for 90 days    magnesium  Oxide 500 mg magnesium  Oral Tablet Take 1 Tablet (500 mg total) by mouth Once a day  multivit-min/folic ac/collagen (WOMEN'S MULTIVITAMIN COLLAGEN ORAL) Take 1 Scoop by mouth Once a day (Patient not taking: Reported on 12/11/2023)    prenatal vitamin-iron-folate Tablet Take 1 Tablet by mouth Daily    sod sulf-pot chloride-mag sulf (SUTAB ) 1.479-0.188- 0.225 gram Oral Tablet Take 12 pills at 10-10:30 am. Take 12 pills at 6-6:30pm. Drink plenty of fluids all day/evening.    sod sulf-pot chloride-mag sulf (SUTAB ) 1.479-0.188- 0.225 gram Oral Tablet Take 12 tablets with specified amount of water the evening  prior to colonoscopy, as directed on the package. Take an additional 12 tablets with specified amount of water the morning of the colonoscopy, as directed on the package. Complete all tablets and required water at least 2 hours prior to procedure.     ROS: Other than issues noted in HPI, all other systems were negative.     Exam:  Vitals:    12/11/23 0838   BP: 132/87   Pulse: 63   SpO2: 96%   Weight: 74.4 kg (164 lb 2 oz)   Height: 1.676 m (5' 6)   BMI: 26.49       General: No acute distress and appears stated age.    HEENT:Head normocephalic, atraumatic. ENT without erythema or injection, mucouse membranes moist.    Neck: No JVD, no carotid bruit. and supple, symmetrical, trachea midline.   Lungs: Clear to auscultation bilaterally.    Cardiovascular: Regular rate and rhythm, normal S1 S2, no murmur, no rub, or gallop, no thrill     Abdomen: Soft, non-tender and bowel sounds normal.    Extremities: Extremities normal, atraumatic, no cyanosis or edema.    Skin: Skin warm and dry.    Neurologic: Alert and oriented x3.  Psych: Mood and affect congruent for age and gender     Orders placed this visit:  Orders Placed This Encounter    EKG (In-Clinic Today)       Assessment/Plan:  Hypertension, unspecified type    Palpitations    Patient stable from cardiac standpoint  Continue to monitor BP at home  LDL within range  Last TSH was normal  Continue atenolol  25 mg daily  Continue HCTZ 12.5 mg daily  Follow up in 1 year     Craig MARLA Glance, PA-C 12/11/2023 09:13

## 2023-12-12 LAB — ECG W INTERP (AMB USE ONLY)(MUSE,IN CLINIC)
Atrial Rate: 62 {beats}/min
Calculated P Axis: 75 degrees
Calculated R Axis: -40 degrees
Calculated T Axis: 59 degrees
PR Interval: 144 ms
QRS Duration: 76 ms
QT Interval: 404 ms
QTC Calculation: 410 ms
Ventricular rate: 62 {beats}/min

## 2023-12-18 ENCOUNTER — Ambulatory Visit
Admission: RE | Admit: 2023-12-18 | Discharge: 2023-12-18 | Discharge status: Home / Self Care | Payer: Self-pay | Source: Ambulatory Visit | Attending: PHYSICIAN ASSISTANT

## 2023-12-18 ENCOUNTER — Other Ambulatory Visit: Payer: Self-pay

## 2023-12-18 ENCOUNTER — Encounter (HOSPITAL_COMMUNITY): Payer: Self-pay

## 2023-12-18 ENCOUNTER — Ambulatory Visit (HOSPITAL_BASED_OUTPATIENT_CLINIC_OR_DEPARTMENT_OTHER)
Admission: RE | Admit: 2023-12-18 | Discharge: 2023-12-18 | Discharge status: Home / Self Care | Payer: Self-pay | Source: Ambulatory Visit | Attending: PHYSICIAN ASSISTANT

## 2023-12-18 DIAGNOSIS — E041 Nontoxic single thyroid nodule: Secondary | ICD-10-CM | POA: Insufficient documentation

## 2023-12-18 DIAGNOSIS — Z1231 Encounter for screening mammogram for malignant neoplasm of breast: Secondary | ICD-10-CM | POA: Insufficient documentation

## 2023-12-20 ENCOUNTER — Ambulatory Visit (HOSPITAL_COMMUNITY): Admitting: Surgery

## 2023-12-20 ENCOUNTER — Encounter (HOSPITAL_COMMUNITY)
Admission: RE | Disposition: A | Discharge status: Home / Self Care | Payer: Self-pay | Source: Ambulatory Visit | Attending: Surgery

## 2023-12-20 ENCOUNTER — Ambulatory Visit
Admission: RE | Admit: 2023-12-20 | Discharge: 2023-12-20 | Disposition: A | Discharge status: Home / Self Care | Source: Ambulatory Visit | Attending: Surgery | Admitting: Surgery

## 2023-12-20 ENCOUNTER — Ambulatory Visit (HOSPITAL_COMMUNITY)

## 2023-12-20 ENCOUNTER — Other Ambulatory Visit (INDEPENDENT_AMBULATORY_CARE_PROVIDER_SITE_OTHER): Payer: Self-pay | Admitting: PHYSICIAN ASSISTANT

## 2023-12-20 ENCOUNTER — Other Ambulatory Visit: Payer: Self-pay

## 2023-12-20 ENCOUNTER — Encounter (HOSPITAL_COMMUNITY): Payer: Self-pay | Admitting: Surgery

## 2023-12-20 DIAGNOSIS — Z860100 Personal history of colon polyps, unspecified: Secondary | ICD-10-CM | POA: Insufficient documentation

## 2023-12-20 DIAGNOSIS — E039 Hypothyroidism, unspecified: Secondary | ICD-10-CM

## 2023-12-20 DIAGNOSIS — R195 Other fecal abnormalities: Secondary | ICD-10-CM | POA: Insufficient documentation

## 2023-12-20 DIAGNOSIS — Q438 Other specified congenital malformations of intestine: Secondary | ICD-10-CM | POA: Insufficient documentation

## 2023-12-20 DIAGNOSIS — Z1211 Encounter for screening for malignant neoplasm of colon: Secondary | ICD-10-CM | POA: Insufficient documentation

## 2023-12-20 DIAGNOSIS — K5730 Diverticulosis of large intestine without perforation or abscess without bleeding: Secondary | ICD-10-CM | POA: Insufficient documentation

## 2023-12-20 SURGERY — COLONOSCOPY
Anesthesia: General | Wound class: Clean Contaminated Wounds-The respiratory, GI, Genital, or urinary

## 2023-12-20 MED ORDER — LACTATED RINGERS INTRAVENOUS SOLUTION
INTRAVENOUS | Status: DC | PRN
Start: 2023-12-20 — End: 2023-12-20
  Administered 2023-12-20: 0 via INTRAVENOUS

## 2023-12-20 MED ORDER — CEFAZOLIN 2 GRAM INTRAVENOUS SOLUTION
INTRAVENOUS | Status: AC
Start: 2023-12-20 — End: 2023-12-20
  Filled 2023-12-20: qty 14.71

## 2023-12-20 MED ORDER — CEFAZOLIN 1 GRAM SOLUTION FOR INJECTION
2.0000 g | INTRAMUSCULAR | Status: AC
Start: 2023-12-20 — End: 2023-12-20
  Administered 2023-12-20: 2 g via INTRAVENOUS

## 2023-12-20 MED ORDER — PROPOFOL 10 MG/ML IV BOLUS
INJECTION | Freq: Once | INTRAVENOUS | Status: DC | PRN
Start: 2023-12-20 — End: 2023-12-20
  Administered 2023-12-20: 50 mg via INTRAVENOUS
  Administered 2023-12-20: 100 mg via INTRAVENOUS

## 2023-12-20 MED ORDER — LIDOCAINE (PF) 100 MG/5 ML (2 %) INTRAVENOUS SYRINGE
INJECTION | Freq: Once | INTRAVENOUS | Status: DC | PRN
Start: 2023-12-20 — End: 2023-12-20
  Administered 2023-12-20: 60 mg via INTRAVENOUS

## 2023-12-20 SURGICAL SUPPLY — 1 items: CLEANER INSTRUMENT PRE-KLENZ 13.5 OZ (MISCELLANEOUS PT CARE ITEMS) ×1 IMPLANT

## 2023-12-20 NOTE — OR Surgeon (Signed)
 [facility]      Patient Name: Tonya Francis, Tonya Francis  Hospital Number: [id number]  Date of Service: 12/20/2023   Date of Birth: 01/31/1956      Pre-Operative Diagnosis: Positive colorectal cancer screening using Cologuard test [R19.5]     Post-Operative Diagnosis: tethering of the sigmoid colon  scattered diverticulosis throughout  redundant colon    Procedure(s)/Description:  COLONOSCOPY: 54621 (CPT)       Attending Surgeon: Amaryllis KATHEE Denise, MD     Anesthesia:  CRNA: Leellen Countryman, CRNA    Anesthesia Type: .General       The patient indicates that they have read and understood the preoperative colonoscopy consent form. The benefits, risks and alternatives to the procedure were discussed. I specifically discussed the risk of bleeding and/or perforation requiring operation.The patient indicates they have no further question and wish to proceed. Informed consent was obtained from the patient and/or medical power of attorney.    The patient was brought into the procedure room and placed on the table in the left lateral decubitus position. After IV sedation was given, full finger digital rectal examination was performed with a circumferential sweep of the distal rectal mucosa. Subsequently, the flexible colonoscope was inserted into the rectum and passed without any difficulty. The colonoscope was then advanced up into the sigmoid colon, descending colon, transverse colon, right colon and cecum without any difficulty. Gross examination of each section of the colon was performed. Cecal intubation was achieved and the appendiceal orifice and ileocecal valve were identified. The operative findings of diverticulosis were noted as described above. The colonoscope was withdrawn carefully examining the mucosa as the scope was being extracted with particular attention paid to the proximal sides of folds, flexures, bends and rectal valves. At approximately 10 cm. from the anal verge, the colonoscope was  retroflexed to fully examine the distal rectum. The colonoscope was removed and a repeat digital rectal examination was performed at the completion of the procedure. The patient tolerated the procedure well. No intraoperative complications were encountered.    EKG, pulse, pulse oximetry and blood pressure were monitored throughout the entire procedure.    There were no unplanned events.    The patient was instructed to contact me if they have any problems with their colon such as bleeding, pain or changes in bowel habits. They understood and agreed to do so.    The patient will not need another screening colonoscopy for 10 years which is according to ASGE guidelines. However, if in the future the patient has any problems with abdominal pain, changes in bowel habits, blood in stool, etc., then they should contact me because they may be a candidate for diagnostic colonoscopy before the 10 year time limit.      Tinslee Klare B. Emelda Kohlbeck, MD, MBA, FACS  [facility] -General Surgery

## 2023-12-20 NOTE — Anesthesia Preprocedure Evaluation (Signed)
 ANESTHESIA PRE-OP EVALUATION  Planned Procedure: COLONOSCOPY  Review of Systems     anesthesia history negative     patient summary reviewed          Pulmonary   asthma,   Cardiovascular    Hypertension, dysrhythmias and hyperlipidemia ,No peripheral edema,  Exercise Tolerance: > or = 4 METS   ,beta blocker therapy  ,taken in last 24 hours     GI/Hepatic/Renal    hiatal hernia and GERD        Endo/Other    hypothyroidism and osteoarthritis,      Neuro/Psych/MS    headaches, depression     Cancer  CA,                   Physical Assessment      Airway       Mallampati: III    TM distance: >3 FB    Mouth Opening: good.            Dental       Dentition intact             Pulmonary    Breath sounds clear to auscultation  (-) no rhonchi, no decreased breath sounds, no wheezes, no rales and no stridor     Cardiovascular    Rhythm: regular  Rate: Normal  (-) no friction rub, carotid bruit is not present, no peripheral edema and no murmur     Other findings            Plan  ASA 3     Planned anesthesia type: general     total intravenous anesthesia                        Anesthesia issues/risks discussed are: Dental Injuries, Aspiration, Stroke and Sore Throat.

## 2023-12-20 NOTE — Anesthesia Postprocedure Evaluation (Signed)
 Anesthesia Post Op Evaluation    Patient: Tonya Francis  Procedure(s):  COLONOSCOPY    Last Vitals:Temperature: 36.2 C (97.2 F) (12/20/23 1427)  Heart Rate: 60 (12/20/23 1427)  BP (Non-Invasive): 115/60 (12/20/23 1427)  Respiratory Rate: 18 (12/20/23 1427)  SpO2: 99 % (12/20/23 1427)    No notable events documented.    Patient is sufficiently recovered from the effects of anesthesia to participate in the evaluation and has returned to their pre-procedure level.  Patient location during evaluation: PACU       Patient participation: complete - patient participated  Level of consciousness: awake and alert and responsive to verbal stimuli    Pain management: adequate  Airway patency: patent    Anesthetic complications: no  Cardiovascular status: acceptable  Respiratory status: acceptable  Hydration status: acceptable  Patient post-procedure temperature: Pt Normothermic   PONV Status: Absent

## 2023-12-20 NOTE — Discharge Instructions (Addendum)
 SURGICAL DISCHARGE INSTRUCTIONS     Dr. Marlyce, Gene B, MD  performed your COLONOSCOPY today at the [facility]    [place]  [facility]:  Monday through Friday from 8 a.m. - 4 p.m.: [phone]    For T&D: [phone]  Between 4 p.m. - 8 a.m., weekends and holidays:  Call ER [phone]    PLEASE SEE WRITTEN HANDOUTS AS DISCUSSED BY YOUR NURSE:  Tonya Wagster RN    SIGNS AND SYMPTOMS OF A WOUND / INCISION INFECTION   Be sure to watch for the following:  Increase in redness or red streaks near or around the wound or incision.  Increase in pain that is intense or severe and cannot be relieved by the pain medication that your doctor has given you.  Increase in swelling that cannot be relieved by elevation of a body part, or by applying ice, if permitted.  Increase in drainage, or if yellow / green in color and smells bad. This could be on a dressing or a cast.  Increase in fever for longer than 24 hours, or an increase that is higher than 101 degrees Fahrenheit (normal body temperature is 98 degrees Fahrenheit). The incision may feel warm to the touch.    **CALL YOUR DOCTOR IF ONE OR MORE OF THESE SIGNS / SYMPTOMS SHOULD OCCUR.    ANESTHESIA INFORMATION   ANESTHESIA -- ADULT PATIENTS:  You have received intravenous sedation / general anesthesia, and you may feel drowsy and light-headed for several hours. You may even experience some forgetfulness of the procedure. DO NOT DRIVE A MOTOR VEHICLE or perform any activity requiring complete alertness or coordination until you feel fully awake in about 24-48 hours. Do not drink alcoholic beverages for at least 24 hours. Do not stay alone, you must have a responsible adult available to be with you. You may also experience a dry mouth or nausea for 24 hours. This is a normal side effect and will disappear as the effects of the medication wear off.    REMEMBER   If you experience any difficulty breathing, chest pain, bleeding that you feel is  excessive, persistent nausea or vomiting or for any other concerns:  Call your physician Dr.  Marlyce, Tonya NOVAK, MD   at [phone] . You may also ask to have the general doctor on call paged. They are available to you 24 hours a day.      SPECIAL INSTRUCTIONS / COMMENTS   POST-OP DIAGNOSIS-TETHERING OF SIGMOID COLON, SCATTERED DIVERTICULOSIS THROUGHOUT, REDUNDANT COLON  REST TODAY--DO NOT DRIVE OR OPERATE ELECTRICAL EQUIPMENT OR SIGN LEGAL DOCUMENTS FOR 24 HOURS  RETURN TO SEE DR GENE DUREMDES AS NEEDED  PATIENT ON TEN YEAR COLON LIST    FOLLOW-UP APPOINTMENTS   Please call your surgeon's office at the number listed to schedule a date / time of return for follow-up.     Dr Tonya Raman  [phone]

## 2023-12-20 NOTE — Nurses Notes (Signed)
 1440 DR GENE DUREMDES IN TO TALK WITH PATIENT REGARDING PROCEDURE

## 2023-12-20 NOTE — H&P (Signed)
 [facility]  General Surgery  History and Physical    Date of Service:  12/20/2023  Tonya Francis, Tonya Francis, 68 y.o. female  Date of Admission:  12/20/2023  Date of Birth:  30-May-1955  PCP: Gerardine Ammon, PA-C    Reason for admission:  Colonoscopy    HPI:  DEMITRA Francis is a 68 y.o. White female who is admitted for Positive colorectal cancer screening using Cologuard test [R19.5]     Tonya Francis presents today for colonoscopy because of positive Cologuard.  The patient did undergo high-risk screening colonoscopy on 08/11/2021 which showed mild sigmoid diverticulosis, redundant colon, scattered diverticulosis throughout as well as grade 2 hemorrhoids.  The patient denies any complaints or problems.     Negative diabetes, blood thinner, family history colon cancer        Review of the result(s) of each unique test:  Patient underwent diagnostic testing ( as above) prior to this dates visit.  I have personally reviewed the results and that serves as a component of the medical decision making for this encounter        Review of prior external note(s) from each unique source:  Patients referral to this office including a recent assessment by the referring provider.  This was reviewed by me for this unique office visit for the indication and intent of the referral as well as any pertinent medical or surgical history relevant to the patients independent evaluation by me today.    Past Medical History:   Diagnosis Date    Allergic rhinitis     Arrhythmia     B12 deficiency     Cancer (CMS HCC)     basal cell skin cancer    Cardiac disease 07/26/2021    DDD (degenerative disc disease), lumbar     Depression     Disorder of thyroid      Dyslipidemia     Elevated LFTs     Esophageal reflux     Fluttering sensation of heart     Hearing loss     Hiatal hernia     History of acne rosacea     History of colonic polyps     History of kidney stones     HNP (herniated nucleus pulposus), lumbar     Hx of  Cataract     Left    Hx of Osteoarthritis     Hypertension     Lung nodule     Palpitations     Pre-diabetes     Rosacea     Tremor     Vitamin D  deficiency       Past Surgical History:   Procedure Laterality Date    ABDOMINAL SURGERY      BOWEL BLOCKAGE    COLONOSCOPY      2014, 2023    DENTAL SURGERY      HEMORROIDECTOMY      HX CATARACT REMOVAL      HX CHOLECYSTECTOMY      HX CYSTOSCOPY      BIOPSY OF UTERUS    HX EYE SURGERY      HX KNEE REPLACMENT Right     HX THYROID  BIOPSY      FNA Bx Benign 2021    HX TONSILLECTOMY      HX TUBAL LIGATION      KNEE ARTHROSCOPY Right     KNEE SURGERY      laproscopic    ORTHOPEDIC SURGERY Right     BROKEN RIGHT  ARM      Social History[1]    Family Medical History:       Problem Relation (Age of Onset)    Breast Cancer Sister    COPD Other    Congestive Heart Failure Other    Coronary Artery Disease Father    Elevated Lipids Other    Heart Attack Other    Heart Disease Brother    Hypertension (High Blood Pressure) Father    Kidney Stones Brother    No Known Problems Maternal Aunt, Maternal Uncle, Paternal Aunt, Paternal Uncle, Maternal Grandmother, Maternal Grandfather, Paternal Grandmother, Paternal Grandfather, Daughter, Son    Primary Brain tumor Sister    Stroke Mother           Medications Prior to Admission       Prescriptions    atenoloL  (TENORMIN ) 25 mg Oral Tablet    Take 1 Tablet (25 mg total) by mouth Daily for 90 days    cholecalciferol, vitamin D3, 25 mcg (1,000 unit) Oral Tablet    Take 2 Tablets (2,000 Units total) by mouth Daily    cyanocobalamin  (VITAMIN B12) 1,000 mcg/mL Injection Solution    INJECT 1 ML (1,000 MCG TOTAL) INTO THE MUSCLE EVERY 14 DAYS EVERY 2 WKS    EPINEPHrine 0.3 mg/0.3 mL Injection Auto-Injector    0.3 ML ONE TIME ONLY BY INTRAMUSCULAR ROUTE FOR 1 DOSE.    esomeprazole  magnesium  (NEXIUM ) 40 mg Oral Capsule, Delayed Release(E.C.)    Take 1 Capsule (40 mg total) by mouth Every morning before breakfast    fluticasone  propionate (FLONASE ) 50  mcg/actuation Nasal Spray, Suspension    INSTILL 1 SPRAY INTO NOSTRIL(S) TWICE DAILY AS NEEDED    hydroCHLOROthiazide  (HYDRODIURIL ) 12.5 mg Oral Tablet    Take 1 Tablet (12.5 mg total) by mouth Once a day for 30 days    hydroCHLOROthiazide  (MICROZIDE ) 12.5 mg Oral Capsule    Take 1 Capsule (12.5 mg total) by mouth Once a day Indications: high blood pressure    hydrocortisone  2.5 % Cream    Apply topically Twice daily    hydrOXYzine  HCL (ATARAX ) 10 mg Oral Tablet    Take 1 Tablet (10 mg total) by mouth Three times a day as needed for Itching    Patient not taking:  Reported on 12/11/2023    Ibuprofen  (MOTRIN ) 800 mg Oral Tablet    Take 1 Tablet (800 mg total) by mouth Twice per day as needed    Levocetirizine (XYZAL) 5 mg Oral Tablet    Take 1 Tablet (5 mg total) by mouth Every evening    levothyroxine  (SYNTHROID) 75 mcg Oral Tablet    Take 1 Tablet (75 mcg total) by mouth Once a day for 90 days    magnesium  Oxide 500 mg magnesium  Oral Tablet    Take 1 Tablet (500 mg total) by mouth Once a day    multivit-min/folic ac/collagen (WOMEN'S MULTIVITAMIN COLLAGEN ORAL)    Take 1 Scoop by mouth Daily    prenatal vitamin-iron-folate Tablet    Take 1 Tablet by mouth Daily    sod sulf-pot chloride-mag sulf (SUTAB ) 1.479-0.188- 0.225 gram Oral Tablet    Take 12 pills at 10-10:30 am. Take 12 pills at 6-6:30pm. Drink plenty of fluids all day/evening.    sod sulf-pot chloride-mag sulf (SUTAB ) 1.479-0.188- 0.225 gram Oral Tablet    Take 12 tablets with specified amount of water the evening prior to colonoscopy, as directed on the package. Take an additional 12 tablets with specified amount  of water the morning of the colonoscopy, as directed on the package. Complete all tablets and required water at least 2 hours prior to procedure.           Allergies[2]       Patient Vitals for the past 24 hrs:   BP Temp Pulse Resp SpO2 Height Weight   12/20/23 1455 (!) 124/52 -- 64 18 99 % -- --   12/20/23 1445 (!) 128/55 -- 62 18 99 % -- --    12/20/23 1430 115/60 36.2 C (97.2 F) 60 18 99 % -- --   12/20/23 1427 115/60 36.2 C (97.2 F) 60 18 99 % -- --   12/20/23 1232 121/71 36.7 C (98.1 F) 70 16 97 % 1.676 m (5' 6) 72.6 kg (160 lb)          General: appropriate for age. in no acute distress.    Vital signs are present above and have been reviewed by me     HEENT: Atraumatic, Normocephalic. PERRLA, EOMI. Nose clear. Throat clear.    Lungs: Nonlabored breathing with symmetric expansion.  Clear to auscultation bilaterally    Heart:Regular wth respect to rate and rythmn.    Abdomen:Soft. Nontender. Nondistended and benign    Extremities:  Grossly normal with good range of motion and no major deformities.    Neuro:  Grossly normal motor and sensory function. CN's II through XII intact.    Psychiatric: Alert and oriented to person, place, and time. affect appropriate    Laboratory Data:     No results found for any visits on 12/20/23 (from the past 24 hours).    Imaging Studies:    No orders to display        Assessment/Plan:  Positive colorectal cancer screening using Cologuard test [R19.5]    Colonoscopy scheduled for 12/20/2023     Discussed indications, risks, and benefits of colonoscopy with possible biopsy/polypectomy with the patient.  Discussed the possibility of polypectomy, biopsies, and possible repeat examinations.  Risks include bleeding, sedation risks, possibility of missed diagnosis of polyp or malignancy, and remote possibilities of perforation and death.  All questions were answered, and informed consent was clearly obtained.    This note was partially created using voice recognition software and is inherently subject to errors including those of syntax and sound alike  substitutions which may escape proof reading. In such instances, original meaning may be extrapolated by contextual derivation.    Michaelina Blandino B Renelda Kilian, MD, MBA, FACS         [1]   Social History  Tobacco Use    Smoking status: Never    Smokeless tobacco: Never   Vaping  Use    Vaping status: Never Used   Substance Use Topics    Alcohol use: Never    Drug use: Never   [2]   Allergies  Allergen Reactions    Latex Itching    Sulfa (Sulfonamides) Swelling and Angioedema    Latex Rash    Lorazepam 

## 2023-12-20 NOTE — Anesthesia Transfer of Care (Signed)
 ANESTHESIA TRANSFER OF CARE   Tonya Francis is a 68 y.o. ,female, Weight: 72.6 kg (160 lb)   had Procedure(s):  COLONOSCOPY  performed  12/20/23   Primary Service: Amaryllis KATHEE Denise, MD    Past Medical History:   Diagnosis Date    Allergic rhinitis     Arrhythmia     B12 deficiency     Cancer (CMS HCC)     basal cell skin cancer    Cardiac disease 07/26/2021    DDD (degenerative disc disease), lumbar     Depression     Disorder of thyroid      Dyslipidemia     Elevated LFTs     Esophageal reflux     Fluttering sensation of heart     Hearing loss     Hiatal hernia     History of acne rosacea     History of colonic polyps     History of kidney stones     HNP (herniated nucleus pulposus), lumbar     Hx of Cataract     Left    Hx of Osteoarthritis     Hypertension     Lung nodule     Palpitations     Pre-diabetes     Rosacea     Tremor     Vitamin D  deficiency       Allergy History as of 12/20/23       SULFA (SULFONAMIDES)         Noted Status Severity Type Reaction    07/26/21 0559 Lum Knee, MA 04/01/18 Active High  Swelling, Angioedema              LATEX         Noted Status Severity Type Reaction    07/26/21 0559 Lum Knee, MA 02/01/21 Active Medium  Itching              LATEX         Noted Status Severity Type Reaction    02/09/23 1621 Jennell Reusing, MA 02/09/23 Active Medium  Rash              LORAZEPAM          Noted Status Severity Type Reaction    11/15/23 1615 Marin Neptune, PA-C 11/15/23 Active                     I completed my transfer of care / handoff to the receiving personnel during which we discussed:  Access, Airway, All key/critical aspects of case discussed, Analgesia, Antibiotics, Expectation of post procedure, Fluids/Product, Gave opportunity for questions and acknowledgement of understanding, Labs and PMHx  Report given to: Vanderbilt Slough, RN    Post Location: PACU                                                           Last OR Temp: Temperature:  36.2 C (97.2 F)  ABG:  POTASSIUM   Date Value Ref Range Status   10/11/2023 3.6 3.5 - 5.1 mmol/L Final     CALCIUM   Date Value Ref Range Status   10/11/2023 9.5 8.6 - 10.3 mg/dL Final     Calculated P Axis   Date Value Ref Range Status   12/11/2023 75 degrees Final  Calculated R Axis   Date Value Ref Range Status   12/11/2023 -40 degrees Final     Calculated T Axis   Date Value Ref Range Status   12/11/2023 59 degrees Final     Airway:* No LDAs found *  Blood pressure 115/60, pulse 60, temperature 36.2 C (97.2 F), resp. rate 18, height 1.676 m (5' 6), weight 72.6 kg (160 lb), SpO2 99%.

## 2023-12-21 ENCOUNTER — Telehealth (INDEPENDENT_AMBULATORY_CARE_PROVIDER_SITE_OTHER): Payer: Self-pay | Admitting: Surgery

## 2023-12-21 ENCOUNTER — Other Ambulatory Visit (INDEPENDENT_AMBULATORY_CARE_PROVIDER_SITE_OTHER): Payer: Self-pay | Admitting: PHYSICIAN ASSISTANT

## 2023-12-21 DIAGNOSIS — E039 Hypothyroidism, unspecified: Secondary | ICD-10-CM

## 2023-12-21 MED ORDER — ESOMEPRAZOLE MAGNESIUM 40 MG CAPSULE,DELAYED RELEASE
40.0000 mg | DELAYED_RELEASE_CAPSULE | Freq: Every morning | ORAL | 1 refills | Status: DC
Start: 2023-12-21 — End: 2024-02-07

## 2023-12-21 MED ORDER — LEVOTHYROXINE 75 MCG TABLET
75.0000 ug | ORAL_TABLET | Freq: Every day | ORAL | 0 refills | Status: DC
Start: 2023-12-21 — End: 2024-02-07

## 2023-12-22 ENCOUNTER — Ambulatory Visit (INDEPENDENT_AMBULATORY_CARE_PROVIDER_SITE_OTHER): Payer: Self-pay | Admitting: PHYSICIAN ASSISTANT

## 2023-12-24 ENCOUNTER — Other Ambulatory Visit: Payer: Self-pay

## 2023-12-26 ENCOUNTER — Ambulatory Visit: Payer: Self-pay | Attending: OTOLARYNGOLOGY | Admitting: OTOLARYNGOLOGY

## 2023-12-26 ENCOUNTER — Encounter (INDEPENDENT_AMBULATORY_CARE_PROVIDER_SITE_OTHER): Payer: Self-pay | Admitting: OTOLARYNGOLOGY

## 2023-12-26 ENCOUNTER — Other Ambulatory Visit: Payer: Self-pay

## 2023-12-26 VITALS — Ht 66.0 in | Wt 160.0 lb

## 2023-12-26 DIAGNOSIS — J309 Allergic rhinitis, unspecified: Secondary | ICD-10-CM | POA: Insufficient documentation

## 2023-12-26 DIAGNOSIS — J342 Deviated nasal septum: Secondary | ICD-10-CM | POA: Insufficient documentation

## 2023-12-26 DIAGNOSIS — K116 Mucocele of salivary gland: Secondary | ICD-10-CM | POA: Insufficient documentation

## 2023-12-26 DIAGNOSIS — E041 Nontoxic single thyroid nodule: Secondary | ICD-10-CM | POA: Insufficient documentation

## 2023-12-26 NOTE — Addendum Note (Signed)
 Addended by: Roniel Halloran C on: 12/26/2023 10:28 AM     Modules accepted: Orders

## 2023-12-26 NOTE — H&P (Signed)
 [facility]  [address]  [place] [address]  Operated by [facility]  Progress Note    Name: Tonya Francis MRN:  [id number]   Date: 12/26/2023 DOB:  27-Nov-1955 (68 y.o.)              Follow Up      Subjective:   Chief Complaint:   Follow-up After Testing (Rc after US )       History of Present Illness:  Tonya Francis is a 68 y.o. old female who presents to the clinic for follow-up after US  of the thyroid  results are below.      PACS Images     Show images for US  THYROID     Imaging Services, [facility]   [address]  [place] [address]   Phone: [phone] Fax: [phone]     PATIENT NAME: Tonya Francis MED REC NO: [id number]   BIRTH DATE: Nov 11, 1955 ORDER: [id number]   SEX: F ORDER FROM: [facility]   REQUESTING PHYS: Margean Anes SERVICE DATE: 12/18/2023 08:57    ATTENDING PHYS: Margean Anes REPORT DATE: 12/18/2023 09:27   REASON: Right thyroid  nodule       Final  US  THYROID                           Study Result    Narrative & Impression   Tonya Francis     RADIOLOGIST: Porter Miyamoto     US  THYROID  performed on 12/18/2023 8:57 AM     CLINICAL HISTORY:   E04.1: Right thyroid  nodule.  rt thyroid  nodule, on thyroid  meds     TECHNIQUE: Thyroid  ultrasound     COMPARISON:  09/22/2022, 12/03/2018, and outside facility ultrasound 10/03/2008.        FINDINGS:  Right thyroid  lobe measures 2.8 cm x 1.7 cm x 2.0 cm.  Left thyroid  lobe measures 2.7 cm x 0.9 cm x 0.9 cm.   Isthmus thickness is 0.2 cm.      Thyroid  Size: Relatively small       Background Echotexture: Normal     Thyroid  Nodules: Solitary right thyroid  nodule measuring 1.4 x 1.3 x 1.9 cm, solid, isoechoic, TI-RADS 3, without threshold interval growth since 2010.        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               Radiologist location ID: [id number]            Review of Systems      Physical Exam:     Vitals:    12/26/23 1015   Weight: 72.6 kg (160 lb)   Height: 1.676 m (5' 6)   BMI: 25.82      ENT Physical Exam  Constitutional  Appearance: patient appears well-developed, well-nourished and well-groomed,  Communication/Voice: communication appropriate for developmental age; vocal quality normal;  Head and Face  Appearance: head appears normal, face appears normal and face appears atraumatic;  Palpation: facial palpation normal;  Salivary: glands normal;  Ear  Hearing: intact;  Auricles: right auricle normal; left auricle normal;  External Mastoids: right external mastoid normal; left external mastoid normal;  Ear Canals: right ear canal normal; left ear canal normal;  Tympanic Membranes: right tympanic membrane normal; left tympanic membrane normal;  Nose  External Nose: nares patent bilaterally; external nose normal;  Internal  Nose: nasal mucosa normal; nasal septal deviation present; bilateral inferior turbinates with hypertrophy;  Oral Cavity/Oropharynx  Lips: normal;  Teeth: normal;  Gums: gingiva normal;  Tongue: normal;  Oral mucosa: normal;  Hard palate: normal;  Neck  Neck: neck normal; neck palpation normal;  Thyroid : thyroid  normal;  Respiratory  Inspection: breathing unlabored; normal breathing rate;  Lymphatic  Palpation: lymph nodes normal;  Neurovestibular  Mental Status: alert and oriented;  Psychiatric: mood normal; affect is appropriate;  Cranial Nerves: cranial nerves intact;       Assessment and Plan:       ICD-10-CM    1. 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   Will repeat US  of the neck.     Continue Claritin, Flonase      Follow up:  Return for Follow up after US .    Tonya Arch, DO

## 2023-12-26 NOTE — Procedures (Signed)
 [facility]  [address]  [place] [address]  Operated by [facility]  Procedure Note    Name: Tonya Francis MRN:  [id number]   Date: 12/26/2023 DOB:  [birth year] (68 y.o.)         31575 - LARYNGOSCOPY, FLEXIBLE DIAGNOSTIC (AMB ONLY)    Performed by: Margean Anes, DO  Authorized by: Margean Anes, DO    Time Out:     Immediately before the procedure, a time out was called:  Yes    Patient verified:  Yes    Procedure Verified:  Yes    Site Verified:  Yes  Documentation:      [facility]  [address]  [place] [address]  Operated by [facility]  Procedure Note    Name: Tonya Francis MRN:  [id number]  Date: 12/26/2023 DOB:  Oct 12, 1955 (68 y.o.)        @PROCDOC @    Indications for procedure: Head / Neck masses    Anesthesia: Oxymetazoline nasal spray    Description: The flexible endoscope was gently introduced into the nostril and passed along the floor of the nose to the nasopharynx. Adenoid was minimal and eustachian tubes normal. The retropalatal airway was patent.    The endoscope was passed to the oropharynx. Base of tongue displayed normal lingual tonsils, patent valelulla, and sharply defined upright epiglottis. Retrolingual airway was patent.    The larynx displayed normal true vocal cords with good mobility. False cords were normal. Arytenoid mucosa was pink with no edema.     The piriform recesses were symmetric without secretion. The hypopharynx was symmetric without lesion.    Findings: Laryngopharyngeal Reflux    The patient tolerated the procedure well.    Anes Margean, DO                 Tonya Andreason Parkin, DO

## 2023-12-29 ENCOUNTER — Other Ambulatory Visit (INDEPENDENT_AMBULATORY_CARE_PROVIDER_SITE_OTHER): Payer: Self-pay | Admitting: OTOLARYNGOLOGY

## 2023-12-29 DIAGNOSIS — E041 Nontoxic single thyroid nodule: Secondary | ICD-10-CM

## 2024-01-02 ENCOUNTER — Other Ambulatory Visit: Payer: Self-pay

## 2024-01-03 ENCOUNTER — Ambulatory Visit (HOSPITAL_BASED_OUTPATIENT_CLINIC_OR_DEPARTMENT_OTHER)
Admission: RE | Admit: 2024-01-03 | Discharge: 2024-01-03 | Disposition: A | Discharge status: Home / Self Care | Source: Ambulatory Visit | Attending: PHYSICIAN ASSISTANT | Admitting: PHYSICIAN ASSISTANT

## 2024-01-03 ENCOUNTER — Other Ambulatory Visit (INDEPENDENT_AMBULATORY_CARE_PROVIDER_SITE_OTHER): Payer: Self-pay | Admitting: PHYSICIAN ASSISTANT

## 2024-01-03 ENCOUNTER — Encounter (INDEPENDENT_AMBULATORY_CARE_PROVIDER_SITE_OTHER): Payer: Self-pay | Admitting: PHYSICIAN ASSISTANT

## 2024-01-03 ENCOUNTER — Ambulatory Visit (HOSPITAL_COMMUNITY)

## 2024-01-03 ENCOUNTER — Ambulatory Visit: Payer: Self-pay | Attending: PHYSICIAN ASSISTANT | Admitting: PHYSICIAN ASSISTANT

## 2024-01-03 VITALS — BP 124/62 | HR 58 | Temp 98.3°F | Resp 14 | Ht 66.0 in | Wt 166.4 lb

## 2024-01-03 DIAGNOSIS — M25569 Pain in unspecified knee: Secondary | ICD-10-CM

## 2024-01-03 DIAGNOSIS — M545 Low back pain, unspecified: Secondary | ICD-10-CM

## 2024-01-03 DIAGNOSIS — M25561 Pain in right knee: Secondary | ICD-10-CM

## 2024-01-03 DIAGNOSIS — M546 Pain in thoracic spine: Secondary | ICD-10-CM

## 2024-01-03 DIAGNOSIS — I10 Essential (primary) hypertension: Secondary | ICD-10-CM | POA: Insufficient documentation

## 2024-01-03 LAB — CBC WITH DIFF
BASOPHIL #: 0.1 x10ˆ3/uL (ref 0.00–0.10)
BASOPHIL %: 1 % (ref 0–1)
EOSINOPHIL #: 0.3 x10ˆ3/uL (ref 0.00–0.50)
EOSINOPHIL %: 4 % (ref 1–7)
HCT: 38.7 % (ref 31.2–41.9)
HGB: 13.5 g/dL (ref 10.9–14.3)
LYMPHOCYTE #: 1.4 x10ˆ3/uL (ref 1.10–3.10)
LYMPHOCYTE %: 19 % (ref 16–46)
MCH: 30.3 pg (ref 24.7–32.8)
MCHC: 34.8 g/dL (ref 32.3–35.6)
MCV: 87 fL (ref 75.5–95.3)
MONOCYTE #: 0.7 x10ˆ3/uL (ref 0.20–0.90)
MONOCYTE %: 9 % (ref 4–11)
MPV: 8.4 fL (ref 7.9–10.8)
NEUTROPHIL #: 5 x10ˆ3/uL (ref 1.90–8.20)
NEUTROPHIL %: 68 % (ref 43–77)
PLATELETS: 224 x10ˆ3/uL (ref 140–440)
RBC: 4.45 x10ˆ6/uL (ref 3.63–4.92)
RDW: 13.7 % (ref 12.3–17.7)
WBC: 7.4 x10ˆ3/uL (ref 3.8–11.8)

## 2024-01-03 LAB — SEDIMENTATION RATE: ERYTHROCYTE SEDIMENTATION RATE (ESR): 12 mm/h (ref <30)

## 2024-01-03 LAB — LDH: LDH: 154 U/L (ref 140–271)

## 2024-01-03 LAB — LACTIC ACID LEVEL: LACTIC ACID: 0.7 mmol/L (ref 0.5–2.2)

## 2024-01-03 MED ORDER — METHOCARBAMOL 500 MG TABLET
500.0000 mg | ORAL_TABLET | Freq: Three times a day (TID) | ORAL | 0 refills | Status: DC
Start: 2024-01-03 — End: 2024-02-07

## 2024-01-03 MED ORDER — ATENOLOL 25 MG TABLET
25.0000 mg | ORAL_TABLET | Freq: Every day | ORAL | 0 refills | Status: DC
Start: 2024-01-03 — End: 2024-02-07

## 2024-01-03 MED ORDER — HYDROCHLOROTHIAZIDE 12.5 MG TABLET
12.5000 mg | ORAL_TABLET | Freq: Every day | ORAL | 0 refills | Status: DC
Start: 2024-01-03 — End: 2024-02-07

## 2024-01-03 MED ORDER — KETOROLAC 30 MG/ML (1 ML) INJECTION SOLUTION
30.0000 mg | Freq: Once | INTRAMUSCULAR | Status: AC
Start: 2024-01-03 — End: 2024-01-03
  Administered 2024-01-03: 30 mg via INTRAMUSCULAR

## 2024-01-03 NOTE — Nursing Note (Signed)
 01/03/24 1047   Fall Risk Assessment   Do you feel unsteady when standing or walking? Yes   Do you worry about falling? No   Have you fallen in the past year? No

## 2024-01-03 NOTE — Progress Notes (Signed)
 [facility]  [address]  [place] [address]  Operated by [facility]    Progress Note    Tonya Francis  12-30-1955  [id number]    Date of Service: 01/03/2024 11:00 AM EDT    Chief complaint:   Chief Complaint   Patient presents with    Follow Up 8 Weeks       Subjective:     This is a case of a 68 y.o. year old female who comes in today for follow up. Pt.reports she's been having a flare up of her back pain and radiates into inner thigh and back pain into thigh and increase with movements for about 7 days.Pt.has been doing some activities at home. Pt.has h/o back problems and hip problems. No fall or injuries. Pt.states she takes Motrin  800mg  with some relief. Pt.reports she continues to have problems with knee.No fall injuries.       Current Outpatient Medications   Medication Sig    atenoloL  (TENORMIN ) 25 mg Oral Tablet Take 1 Tablet (25 mg total) by mouth Daily    cholecalciferol, vitamin D3, 25 mcg (1,000 unit) Oral Tablet Take 2 Tablets (2,000 Units total) by mouth Daily    cyanocobalamin  (VITAMIN B12) 1,000 mcg/mL Injection Solution INJECT 1 ML (1,000 MCG TOTAL) INTO THE MUSCLE EVERY 14 DAYS EVERY 2 WKS    EPINEPHrine 0.3 mg/0.3 mL Injection Auto-Injector 0.3 ML ONE TIME ONLY BY INTRAMUSCULAR ROUTE FOR 1 DOSE.    esomeprazole  magnesium  (NEXIUM ) 40 mg Oral Capsule, Delayed Release(E.C.) Take 1 Capsule (40 mg total) by mouth Every morning before breakfast    fluticasone  propionate (FLONASE ) 50 mcg/actuation Nasal Spray, Suspension INSTILL 1 SPRAY INTO NOSTRIL(S) TWICE DAILY AS NEEDED    hydroCHLOROthiazide  (HYDRODIURIL ) 12.5 mg Oral Tablet Take 1 Tablet (12.5 mg total) by mouth Daily    hydroCHLOROthiazide  (MICROZIDE ) 12.5 mg Oral Capsule Take 1 Capsule (12.5 mg total) by mouth Once a day Indications: high blood pressure (Patient not taking: Reported on 01/03/2024)    hydrocortisone  2.5 % Cream Apply topically Twice daily    hydrOXYzine  HCL (ATARAX ) 10 mg  Oral Tablet Take 1 Tablet (10 mg total) by mouth Three times a day as needed for Itching    Ibuprofen  (MOTRIN ) 800 mg Oral Tablet Take 1 Tablet (800 mg total) by mouth Twice per day as needed    Levocetirizine (XYZAL) 5 mg Oral Tablet Take 1 Tablet (5 mg total) by mouth Every evening    levothyroxine  (SYNTHROID) 75 mcg Oral Tablet Take 1 Tablet (75 mcg total) by mouth Daily for 90 days    magnesium  Oxide 500 mg magnesium  Oral Tablet Take 1 Tablet (500 mg total) by mouth Once a day    methocarbamoL  (ROBAXIN ) 500 mg Oral Tablet Take 1 Tablet (500 mg total) by mouth Three times a day    multivit-min/folic ac/collagen (WOMEN'S MULTIVITAMIN COLLAGEN ORAL) Take 1 Scoop by mouth Daily    prenatal vitamin-iron-folate Tablet Take 1 Tablet by mouth Daily    sod sulf-pot chloride-mag sulf (SUTAB ) 1.479-0.188- 0.225 gram Oral Tablet Take 12 pills at 10-10:30 am. Take 12 pills at 6-6:30pm. Drink plenty of fluids all day/evening. (Patient not taking: Reported on 01/03/2024)       Objective:     BP 124/62   Pulse 58   Temp 36.8 C (98.3 F) (Temporal)   Resp 14   Ht 1.676 m (5' 6)   Wt 75.5 kg (166 lb 6.4 oz)   SpO2  98%   BMI 26.86 kg/m     Physical Exam  Vitals and nursing note reviewed.   Constitutional:       Appearance: Normal appearance.   Cardiovascular:      Rate and Rhythm: Normal rate and regular rhythm.   Pulmonary:      Effort: Pulmonary effort is normal.      Breath sounds: Normal breath sounds.   Musculoskeletal:         General: Swelling, tenderness and deformity present.      Right lower leg: Edema present.      Comments: Right knee with warmth and mild edema noted, limited ROM with extension, gait with mild limp.Back: tender mid-lumbar area with tenderness noted and tension. Some discomfort with right spinal rotation           Assessment & Plan  Hypertension, unspecified type    Orders:    hydroCHLOROthiazide  (HYDRODIURIL ) 12.5 mg Oral Tablet; Take 1 Tablet (12.5 mg total) by mouth Daily    SEDIMENTATION  RATE; Future    LACTIC ACID LEVEL (NO REFLEX); Future    Lumbar back pain    Orders:    Referral to PHYSICAL THERAPY - [place]; Future    XR LUMBAR SPINE AP/OBLIQUES/LAT/SPOT; Future    Thoracic spine pain    Orders:    Referral to PHYSICAL THERAPY - [place]; Future    XR THORACIC SPINE 3 VIEWS; Future    Knee pain    Orders:    CBC/DIFF; Future    SEDIMENTATION RATE; Future    LACTIC ACID LEVEL (NO REFLEX); Future    LDH; Future       Other orders    atenoloL  (TENORMIN ) 25 mg Oral Tablet; Take 1 Tablet (25 mg total) by mouth Daily    methocarbamoL  (ROBAXIN ) 500 mg Oral Tablet; Take 1 Tablet (500 mg total) by mouth Three times a day    ketorolac  (TORADOL ) 30 mg/mL injection          The patient was given the opportunity to ask questions and those questions were answered to the patient's satisfaction. The patient was encouraged to call with any additional questions or concerns.    Follow up: Return in about 4 weeks (around 01/31/2024), or 30 minute.    Gerardine Ammon, PA-C

## 2024-01-03 NOTE — Assessment & Plan Note (Addendum)
 Orders:    hydroCHLOROthiazide  (HYDRODIURIL ) 12.5 mg Oral Tablet; Take 1 Tablet (12.5 mg total) by mouth Daily    SEDIMENTATION RATE; Future    LACTIC ACID LEVEL (NO REFLEX); Future

## 2024-01-03 NOTE — Nursing Note (Signed)
 01/03/24 1044   PHQ 9 (follow up)   Little interest or pleasure in doing things. 1   Feeling down, depressed, or hopeless 0   Trouble falling or staying asleep, or sleeping too much. 3   Feeling tired or having little energy 3   Poor appetite or overeating 1   Feeling bad about yourself/ that you are a failure in the past 2 weeks? 0   Trouble concentrating on things in the past 2 weeks? 0   Moving/Speaking slowly or being fidgety or restless  in the past 2 weeks? 0   Thoughts that you would be better off DEAD, or of hurting yourself in some way. 0   If you checked off any problems, how difficult have these problems made it for you to do your work, take care of things at home, or get along with other people? Somewhat difficult   PHQ 9 Total 8   Interpretation of Total Score Mild depression

## 2024-01-03 NOTE — Nursing Note (Signed)
 01/03/24 1043   Domestic Violence   Because we are aware of abuse and domestic violence today, we ask all patients: Are you being hurt, hit, or frightened by anyone at your home or in your life?  N   Basic Needs   Do you have any basic needs within your home that are not being met? (such as Food, Shelter, [employer], Tranportation, paying for bills and/or medications) N

## 2024-01-07 ENCOUNTER — Other Ambulatory Visit (INDEPENDENT_AMBULATORY_CARE_PROVIDER_SITE_OTHER): Payer: Self-pay | Admitting: PHYSICIAN ASSISTANT

## 2024-01-08 ENCOUNTER — Ambulatory Visit (INDEPENDENT_AMBULATORY_CARE_PROVIDER_SITE_OTHER): Payer: Self-pay | Admitting: PHYSICIAN ASSISTANT

## 2024-01-30 ENCOUNTER — Other Ambulatory Visit (INDEPENDENT_AMBULATORY_CARE_PROVIDER_SITE_OTHER): Payer: Self-pay

## 2024-02-02 ENCOUNTER — Ambulatory Visit (HOSPITAL_COMMUNITY): Payer: Self-pay

## 2024-02-05 ENCOUNTER — Ambulatory Visit (HOSPITAL_COMMUNITY): Payer: Self-pay

## 2024-02-07 ENCOUNTER — Ambulatory Visit: Payer: Self-pay | Attending: PHYSICIAN ASSISTANT | Admitting: PHYSICIAN ASSISTANT

## 2024-02-07 ENCOUNTER — Other Ambulatory Visit: Payer: Self-pay

## 2024-02-07 ENCOUNTER — Encounter (INDEPENDENT_AMBULATORY_CARE_PROVIDER_SITE_OTHER): Payer: Self-pay | Admitting: PHYSICIAN ASSISTANT

## 2024-02-07 VITALS — BP 133/78 | HR 58 | Temp 96.8°F | Resp 16 | Ht 66.0 in | Wt 166.4 lb

## 2024-02-07 DIAGNOSIS — M62838 Other muscle spasm: Secondary | ICD-10-CM | POA: Insufficient documentation

## 2024-02-07 DIAGNOSIS — M25561 Pain in right knee: Secondary | ICD-10-CM | POA: Insufficient documentation

## 2024-02-07 DIAGNOSIS — I10 Essential (primary) hypertension: Secondary | ICD-10-CM | POA: Insufficient documentation

## 2024-02-07 DIAGNOSIS — E039 Hypothyroidism, unspecified: Secondary | ICD-10-CM | POA: Insufficient documentation

## 2024-02-07 DIAGNOSIS — N3941 Urge incontinence: Secondary | ICD-10-CM | POA: Insufficient documentation

## 2024-02-07 LAB — POCT URINE DIPSTICK
BILIRUBIN: NEGATIVE
GLUCOSE: NEGATIVE
KETONE: NEGATIVE
LEUKOCYTES: NEGATIVE
NITRITE: NEGATIVE
PH: 6.5
PROTEIN: NEGATIVE
SPECIFIC GRAVITY: 1.015
UROBILINOGEN: 0.2

## 2024-02-07 MED ORDER — CYANOCOBALAMIN (VIT B-12) 1,000 MCG/ML INJECTION SOLUTION
1000.0000 ug | INTRAMUSCULAR | 1 refills | Status: AC
Start: 2024-02-07 — End: ?

## 2024-02-07 MED ORDER — LEVOCETIRIZINE 5 MG TABLET
5.0000 mg | ORAL_TABLET | Freq: Every evening | ORAL | 1 refills | Status: AC
Start: 2024-02-07 — End: ?

## 2024-02-07 MED ORDER — MAGNESIUM 500 MG (AS MAGNESIUM OXIDE) TABLET
500.0000 mg | ORAL_TABLET | Freq: Every day | ORAL | 0 refills | Status: AC
Start: 2024-02-07 — End: ?

## 2024-02-07 MED ORDER — FLUTICASONE PROPIONATE 50 MCG/ACTUATION NASAL SPRAY,SUSPENSION
1.0000 | Freq: Two times a day (BID) | NASAL | 3 refills | Status: AC
Start: 2024-02-07 — End: ?

## 2024-02-07 MED ORDER — IBUPROFEN 800 MG TABLET
800.0000 mg | ORAL_TABLET | Freq: Two times a day (BID) | ORAL | 0 refills | Status: AC | PRN
Start: 2024-02-07 — End: ?

## 2024-02-07 MED ORDER — HYDROXYZINE HCL 10 MG TABLET
10.0000 mg | ORAL_TABLET | Freq: Three times a day (TID) | ORAL | 0 refills | Status: AC | PRN
Start: 2024-02-07 — End: ?

## 2024-02-07 MED ORDER — KETOROLAC 30 MG/ML (1 ML) INJECTION SOLUTION
30.0000 mg | Freq: Once | INTRAMUSCULAR | Status: AC
Start: 2024-02-07 — End: 2024-02-07
  Administered 2024-02-07: 30 mg via INTRAMUSCULAR

## 2024-02-07 MED ORDER — HYDROCHLOROTHIAZIDE 12.5 MG TABLET
12.5000 mg | ORAL_TABLET | Freq: Every day | ORAL | 0 refills | Status: DC
Start: 2024-02-07 — End: 2024-02-13

## 2024-02-07 MED ORDER — LEVOTHYROXINE 75 MCG TABLET
75.0000 ug | ORAL_TABLET | Freq: Every day | ORAL | 0 refills | Status: AC
Start: 2024-02-07 — End: 2024-05-07

## 2024-02-07 MED ORDER — ESOMEPRAZOLE MAGNESIUM 40 MG CAPSULE,DELAYED RELEASE
40.0000 mg | DELAYED_RELEASE_CAPSULE | Freq: Every morning | ORAL | 1 refills | Status: AC
Start: 2024-02-07 — End: ?

## 2024-02-07 MED ORDER — ATENOLOL 25 MG TABLET
25.0000 mg | ORAL_TABLET | Freq: Every day | ORAL | 0 refills | Status: AC
Start: 2024-02-07 — End: 2024-05-07

## 2024-02-07 NOTE — Assessment & Plan Note (Signed)
 Orders:    hydroCHLOROthiazide  (HYDRODIURIL ) 12.5 mg Oral Tablet; Take 1 Tablet (12.5 mg total) by mouth Daily    LIPID PANEL; Future    BASIC METABOLIC PANEL; Future    MAGNESIUM ; Future    HEPATIC FUNCTION PANEL; Future

## 2024-02-07 NOTE — Nursing Note (Signed)
 02/07/24 0913   Depression Screen   Little interest or pleasure in doing things. 0   Feeling down, depressed, or hopeless 0   PHQ 2 Total 0

## 2024-02-07 NOTE — Progress Notes (Signed)
 FAMILY MEDICINE, MEDICAL OFFICE BUILDING  [address]  [place] [address]  Operated by [facility]    Progress Note    Tonya Francis  02/08/1956  [id number]    Date of Service: 02/07/2024  9:30 AM EDT    Chief complaint:   Chief Complaint   Patient presents with    Follow Up 4 Weeks       Subjective:     This is a case of a 68 y.o. year old female who comes in today for follow up. Pt.states she is doing exercises for her knee pain. 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 Pt.states she continues to have her massage therapy for her neck and back pain and muscle spasms and is therapeutic.Pt.states she is having trouble getting her B12 needles/for injections. Pt.also going to [employer].      Review of Systems   Constitutional: Negative.    Respiratory: Negative.     Cardiovascular: Negative.    Gastrointestinal: Negative.    Genitourinary:         Pt.noticed some urine dribbling past couple days,    Musculoskeletal:  Positive for arthralgias, gait problem and joint swelling.        Right knee pain continues but not as bed   Neurological:  Negative for dizziness and headaches.   Psychiatric/Behavioral: Negative.          Current Outpatient Medications   Medication Sig    atenoloL  (TENORMIN ) 25 mg Oral Tablet Take 1 Tablet (25 mg total) by mouth Daily    cholecalciferol, vitamin D3, 25 mcg (1,000 unit) Oral Tablet Take 2 Tablets (2,000 Units total) by mouth Daily    cyanocobalamin  (VITAMIN B12) 1,000 mcg/mL Injection Solution Inject 1 mL (1,000 mcg total) into the muscle Every 14 days Every 2 wks    EPINEPHrine 0.3 mg/0.3 mL Injection Auto-Injector 0.3 ML ONE TIME ONLY BY INTRAMUSCULAR ROUTE FOR 1 DOSE.    esomeprazole  magnesium  (NEXIUM ) 40 mg Oral Capsule, Delayed Release(E.C.) Take 1 Capsule (40 mg total) by mouth Every morning before breakfast    fluticasone  propionate (FLONASE ) 50 mcg/actuation Nasal Spray, Suspension Administer 1 Spray into each nostril Twice daily     hydroCHLOROthiazide  (HYDRODIURIL ) 12.5 mg Oral Tablet Take 1 Tablet (12.5 mg total) by mouth Daily    hydrocortisone  2.5 % Cream Apply topically Twice daily    hydrOXYzine  HCL (ATARAX ) 10 mg Oral Tablet Take 1 Tablet (10 mg total) by mouth Three times a day as needed for Itching    Ibuprofen  (MOTRIN ) 800 mg Oral Tablet Take 1 Tablet (800 mg total) by mouth Twice per day as needed    Levocetirizine (XYZAL) 5 mg Oral Tablet Take 1 Tablet (5 mg total) by mouth Every evening    levothyroxine  (SYNTHROID) 75 mcg Oral Tablet Take 1 Tablet (75 mcg total) by mouth Daily    magnesium  Oxide 500 mg magnesium  Oral Tablet Take 1 Tablet (500 mg total) by mouth Daily    prenatal vitamin-iron-folate Tablet Take 1 Tablet by mouth Daily       Objective:     BP 133/78   Pulse 58   Temp 36 C (96.8 F) (Temporal)   Resp 16   Ht 1.676 m (5' 6)   Wt 75.5 kg (166 lb 6.4 oz)   SpO2 96%   BMI 26.86 kg/m     Physical Exam  Vitals and nursing note reviewed.   Constitutional:       General: She is not  in acute distress.     Appearance: Normal appearance. She is normal weight.   HENT:      Head: Normocephalic and atraumatic.      Mouth/Throat:      Mouth: Mucous membranes are moist.      Pharynx: Oropharynx is clear.   Eyes:      Extraocular Movements: Extraocular movements intact.      Conjunctiva/sclera: Conjunctivae normal.   Neck:      Thyroid : No thyromegaly.      Vascular: No carotid bruit.   Cardiovascular:      Rate and Rhythm: Normal rate and regular rhythm.      Pulses: Normal pulses.      Heart sounds: Normal heart sounds. No murmur heard.  Pulmonary:      Effort: Pulmonary effort is normal.      Breath sounds: Normal breath sounds.   Abdominal:      General: Abdomen is flat. Bowel sounds are normal.      Palpations: Abdomen is soft.   Musculoskeletal:         General: No swelling or deformity. Normal range of motion.      Cervical back: Normal range of motion. No tenderness.   Lymphadenopathy:      Cervical: No cervical  adenopathy.   Skin:     General: Skin is warm and dry.   Neurological:      General: No focal deficit present.      Mental Status: She is alert. Mental status is at baseline.      Cranial Nerves: No cranial nerve deficit.      Motor: No weakness.   Psychiatric:         Mood and Affect: Mood normal.         Thought Content: Thought content normal.           Assessment & Plan  Hypertension, unspecified type    Orders:    hydroCHLOROthiazide  (HYDRODIURIL ) 12.5 mg Oral Tablet; Take 1 Tablet (12.5 mg total) by mouth Daily    LIPID PANEL; Future    BASIC METABOLIC PANEL; Future    MAGNESIUM ; Future    HEPATIC FUNCTION PANEL; Future    Hypothyroidism, unspecified type    Orders:    levothyroxine  (SYNTHROID) 75 mcg Oral Tablet; Take 1 Tablet (75 mcg total) by mouth Daily    THYROID  STIMULATING HORMONE (SENSITIVE TSH); Future    Urgency incontinence    Orders:    POCT Urine Dipstick    Knee pain, right  Continue exercises regimen   Continue meds as directed       Muscle spasms of neck            Other orders    cyanocobalamin  (VITAMIN B12) 1,000 mcg/mL Injection Solution; Inject 1 mL (1,000 mcg total) into the muscle Every 14 days Every 2 wks    atenoloL  (TENORMIN ) 25 mg Oral Tablet; Take 1 Tablet (25 mg total) by mouth Daily    Levocetirizine (XYZAL) 5 mg Oral Tablet; Take 1 Tablet (5 mg total) by mouth Every evening    esomeprazole  magnesium  (NEXIUM ) 40 mg Oral Capsule, Delayed Release(E.C.); Take 1 Capsule (40 mg total) by mouth Every morning before breakfast    fluticasone  propionate (FLONASE ) 50 mcg/actuation Nasal Spray, Suspension; Administer 1 Spray into each nostril Twice daily    hydrOXYzine  HCL (ATARAX ) 10 mg Oral Tablet; Take 1 Tablet (10 mg total) by mouth Three times a day as needed for Itching  Ibuprofen  (MOTRIN ) 800 mg Oral Tablet; Take 1 Tablet (800 mg total) by mouth Twice per day as needed    magnesium  Oxide 500 mg magnesium  Oral Tablet; Take 1 Tablet (500 mg total) by mouth Daily    ketorolac  (TORADOL )  30 mg/mL injection          The patient was given the opportunity to ask questions and those questions were answered to the patient's satisfaction. The patient was encouraged to call with any additional questions or concerns.    Follow up: No follow-ups on file.    Gerardine Ammon, PA-C

## 2024-02-07 NOTE — Nursing Note (Signed)
 The influenza vaccine was not given because the patient/caregiver declined.

## 2024-02-07 NOTE — Nursing Note (Signed)
 02/07/24 0913   Domestic Violence   Because we are aware of abuse and domestic violence today, we ask all patients: Are you being hurt, hit, or frightened by anyone at your home or in your life?  N   Basic Needs   Do you have any basic needs within your home that are not being met? (such as Food, Shelter, [employer], Tranportation, paying for bills and/or medications) N

## 2024-02-07 NOTE — Nursing Note (Signed)
 02/07/24 0913   Fall Risk Assessment   Do you feel unsteady when standing or walking? No   Do you worry about falling? Yes   Have you fallen in the past year? No

## 2024-02-07 NOTE — Assessment & Plan Note (Signed)
 Orders:    levothyroxine  (SYNTHROID) 75 mcg Oral Tablet; Take 1 Tablet (75 mcg total) by mouth Daily    THYROID  STIMULATING HORMONE (SENSITIVE TSH); Future

## 2024-02-08 ENCOUNTER — Ambulatory Visit
Admission: RE | Admit: 2024-02-08 | Discharge: 2024-02-08 | Disposition: A | Discharge status: Home / Self Care | Source: Ambulatory Visit | Attending: OTOLARYNGOLOGY | Admitting: OTOLARYNGOLOGY

## 2024-02-08 DIAGNOSIS — K116 Mucocele of salivary gland: Secondary | ICD-10-CM | POA: Insufficient documentation

## 2024-02-08 DIAGNOSIS — K118 Other diseases of salivary glands: Secondary | ICD-10-CM

## 2024-02-13 ENCOUNTER — Other Ambulatory Visit (INDEPENDENT_AMBULATORY_CARE_PROVIDER_SITE_OTHER): Payer: Self-pay | Admitting: PHYSICIAN ASSISTANT

## 2024-02-13 DIAGNOSIS — I10 Essential (primary) hypertension: Secondary | ICD-10-CM

## 2024-02-13 MED ORDER — HYDROCHLOROTHIAZIDE 12.5 MG TABLET
12.5000 mg | ORAL_TABLET | Freq: Every day | ORAL | 0 refills | Status: DC
Start: 2024-02-13 — End: 2024-03-06

## 2024-02-19 ENCOUNTER — Other Ambulatory Visit: Payer: Self-pay

## 2024-02-19 ENCOUNTER — Ambulatory Visit: Attending: PHYSICIAN ASSISTANT

## 2024-02-19 DIAGNOSIS — I10 Essential (primary) hypertension: Secondary | ICD-10-CM | POA: Insufficient documentation

## 2024-02-19 DIAGNOSIS — E039 Hypothyroidism, unspecified: Secondary | ICD-10-CM | POA: Insufficient documentation

## 2024-02-19 LAB — LIPID PANEL
CHOL/HDL RATIO: 4
CHOLESTEROL: 170 mg/dL (ref <200)
HDL CHOL: 42 mg/dL (ref >=40)
LDL CALC: 72 mg/dL (ref 0–100)
TRIGLYCERIDES: 278 mg/dL — ABNORMAL HIGH (ref <=150)
VLDL CALC: 56 mg/dL — ABNORMAL HIGH (ref 0–50)

## 2024-02-19 LAB — BASIC METABOLIC PANEL
ANION GAP: 8 mmol/L (ref 4–13)
BUN/CREA RATIO: 22 (ref 6–22)
BUN: 19 mg/dL (ref 7–25)
CALCIUM: 9.6 mg/dL (ref 8.6–10.3)
CHLORIDE: 104 mmol/L (ref 98–107)
CO2 TOTAL: 30 mmol/L (ref 21–31)
CREATININE: 0.87 mg/dL (ref 0.60–1.30)
ESTIMATED GFR: 72 mL/min/1.73mˆ2 (ref >59)
GLUCOSE: 103 mg/dL (ref 74–109)
OSMOLALITY, CALCULATED: 286 mosm/kg (ref 270–290)
POTASSIUM: 3.9 mmol/L (ref 3.5–5.1)
SODIUM: 142 mmol/L (ref 136–145)

## 2024-02-19 LAB — HEPATIC FUNCTION PANEL
ALBUMIN/GLOBULIN RATIO: 1.6 — ABNORMAL HIGH (ref 0.8–1.4)
ALBUMIN: 4.4 g/dL (ref 3.5–5.7)
ALKALINE PHOSPHATASE: 125 U/L — ABNORMAL HIGH (ref 34–104)
ALT (SGPT): 16 U/L (ref 7–52)
AST (SGOT): 16 U/L (ref 13–39)
BILIRUBIN DIRECT: 0.1 md/dL (ref 0.03–0.18)
BILIRUBIN TOTAL: 0.4 mg/dL (ref 0.3–1.0)
BILIRUBIN, INDIRECT: 0.3 mg/dL (ref <=1)
GLOBULIN: 2.7 (ref 2.0–3.5)
PROTEIN TOTAL: 7.1 g/dL (ref 6.4–8.9)

## 2024-02-19 LAB — THYROID STIMULATING HORMONE (SENSITIVE TSH): TSH: 1.845 u[IU]/mL (ref 0.450–5.330)

## 2024-02-19 LAB — MAGNESIUM: MAGNESIUM: 2 mg/dL (ref 1.9–2.7)

## 2024-02-21 ENCOUNTER — Ambulatory Visit (INDEPENDENT_AMBULATORY_CARE_PROVIDER_SITE_OTHER): Payer: Self-pay | Admitting: PHYSICIAN ASSISTANT

## 2024-02-22 ENCOUNTER — Encounter (INDEPENDENT_AMBULATORY_CARE_PROVIDER_SITE_OTHER): Payer: Self-pay | Admitting: OTOLARYNGOLOGY

## 2024-02-22 ENCOUNTER — Other Ambulatory Visit: Payer: Self-pay

## 2024-02-22 ENCOUNTER — Ambulatory Visit: Payer: Self-pay | Attending: OTOLARYNGOLOGY | Admitting: OTOLARYNGOLOGY

## 2024-02-22 VITALS — Ht 66.0 in | Wt 166.0 lb

## 2024-02-22 DIAGNOSIS — J342 Deviated nasal septum: Secondary | ICD-10-CM | POA: Insufficient documentation

## 2024-02-22 DIAGNOSIS — J3489 Other specified disorders of nose and nasal sinuses: Secondary | ICD-10-CM | POA: Insufficient documentation

## 2024-02-22 DIAGNOSIS — H9312 Tinnitus, left ear: Secondary | ICD-10-CM | POA: Insufficient documentation

## 2024-02-22 DIAGNOSIS — K116 Mucocele of salivary gland: Secondary | ICD-10-CM | POA: Insufficient documentation

## 2024-02-22 DIAGNOSIS — J309 Allergic rhinitis, unspecified: Secondary | ICD-10-CM | POA: Insufficient documentation

## 2024-02-22 DIAGNOSIS — E041 Nontoxic single thyroid nodule: Secondary | ICD-10-CM | POA: Insufficient documentation

## 2024-02-22 NOTE — Addendum Note (Signed)
 Addended by: ISADORA POWELL JANSKY on: 02/22/2024 03:02 PM     Modules accepted: Orders

## 2024-02-22 NOTE — H&P (Signed)
 ENT, [facility]  [address]  [place] [address]  Operated by [facility]  Progress Note    Name: Tonya Francis MRN:  [id number]   Date: 02/22/2024 DOB:  12-07-1955 (68 y.o.)              Follow Up      Subjective:   Chief Complaint:   Follow-up After Testing (F/u on Ct neck 02/08/2024 )       History of Present Illness:  Tonya Francis is a 68 y.o. old female who presents to the clinic for follow-up after US  of the neck results are below.      PACS Images     Show images for US  SOFT TISSUE NECK    Imaging Services, [facility]   [address]  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  F/U RIGHT PAROTID     TECHNIQUE: Ultrasound targeted to the palpable abnormality at the right parotid gland.     COMPARISON: 06/12/2023     FINDINGS:  6.7 mm x 5.4 mm x 5.0 mm hypoechoic lesion right parotid. There is some acoustic enhancement. This appears to be cystic. This measures minimally smaller than it did on 06/12/2023 allowing for differences in measurement technique.        IMPRESSION:  CYSTIC LESION RIGHT PAROTID MEASURES MINIMALLY SMALLER THAN ON 06/12/2023.                    Radiologist location ID: [id number]             Review of Systems     Physical Exam:     Vitals:    02/22/24 1447   Weight: 75.3 kg (166 lb)   Height: 1.676 m (5' 6)   BMI: 26.79      ENT Physical Exam  Constitutional  Appearance: patient appears  well-developed, well-nourished and well-groomed,  Communication/Voice: communication appropriate for developmental age; vocal quality normal;  Head and Face  Appearance: head appears normal, face appears normal and face appears atraumatic;  Palpation: facial palpation normal;  Salivary: glands normal;  Ear  Hearing: intact;  Auricles: right auricle normal; left auricle normal;  External Mastoids: right external mastoid normal; left external mastoid normal;  Ear Canals: right ear canal normal; left ear canal normal;  Tympanic Membranes: right tympanic membrane normal; left tympanic membrane normal;  Nose  External Nose: nares patent bilaterally; external nose normal;  Internal Nose: nasal mucosa normal; nasal septal deviation present; bilateral inferior turbinates with hypertrophy;  Oral Cavity/Oropharynx  Lips: normal;  Teeth: normal;  Gums: gingiva normal;  Tongue: normal;  Oral mucosa: normal;  Hard palate: normal;  Neck  Neck: neck  normal; neck palpation normal;  Thyroid : thyroid  normal;  Respiratory  Inspection: breathing unlabored; normal breathing rate;  Lymphatic  Palpation: lymph nodes normal;  Neurovestibular  Mental Status: alert and oriented;  Psychiatric: mood normal; affect is appropriate;  Cranial Nerves: cranial nerves intact;       Assessment and Plan:       ICD-10-CM    1. 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 Allergic rhinitis, unspecified seasonality, unspecified trigger  J30.9       4. 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 Nasal vestibulitis  J34.89           US  thyroid  reviewed and patient is wanting repeat US  in 1 year.   Will repeat US  of the neck in 1 year.     Continue xyzal, Flonase  for chronic allergic rhinitis.      Follow up:  Return for Follow up after US .    Oneil Arch, DO

## 2024-03-06 ENCOUNTER — Other Ambulatory Visit (INDEPENDENT_AMBULATORY_CARE_PROVIDER_SITE_OTHER): Payer: Self-pay | Admitting: PHYSICIAN ASSISTANT

## 2024-03-06 DIAGNOSIS — I10 Essential (primary) hypertension: Secondary | ICD-10-CM

## 2024-03-06 MED ORDER — HYDROCHLOROTHIAZIDE 12.5 MG TABLET: 12.5000 mg | ORAL_TABLET | Freq: Every day | ORAL | 0 refills | Status: DC

## 2024-03-11 ENCOUNTER — Other Ambulatory Visit: Payer: Self-pay

## 2024-03-11 ENCOUNTER — Ambulatory Visit: Attending: PHYSICIAN ASSISTANT

## 2024-03-11 ENCOUNTER — Other Ambulatory Visit (INDEPENDENT_AMBULATORY_CARE_PROVIDER_SITE_OTHER): Payer: Self-pay | Admitting: PHYSICIAN ASSISTANT

## 2024-03-11 DIAGNOSIS — R748 Abnormal levels of other serum enzymes: Secondary | ICD-10-CM | POA: Insufficient documentation

## 2024-03-11 LAB — FERRITIN: FERRITIN: 115 ng/mL (ref 11–307)

## 2024-03-11 LAB — HEPATIC FUNCTION PANEL
ALBUMIN/GLOBULIN RATIO: 1.7 — ABNORMAL HIGH (ref 0.8–1.4)
ALBUMIN: 4.5 g/dL (ref 3.5–5.7)
ALKALINE PHOSPHATASE: 111 U/L — ABNORMAL HIGH (ref 34–104)
ALT (SGPT): 15 U/L (ref 7–52)
AST (SGOT): 18 U/L (ref 13–39)
BILIRUBIN DIRECT: 0.04 md/dL (ref 0.03–0.18)
BILIRUBIN TOTAL: 0.4 mg/dL (ref 0.3–1.0)
BILIRUBIN, INDIRECT: 0.36 mg/dL (ref <=1)
GLOBULIN: 2.7 (ref 2.0–3.5)
PROTEIN TOTAL: 7.2 g/dL (ref 6.4–8.9)

## 2024-03-13 ENCOUNTER — Ambulatory Visit (INDEPENDENT_AMBULATORY_CARE_PROVIDER_SITE_OTHER): Payer: Self-pay | Admitting: PHYSICIAN ASSISTANT

## 2024-03-18 ENCOUNTER — Other Ambulatory Visit: Payer: Self-pay

## 2024-03-18 ENCOUNTER — Encounter (INDEPENDENT_AMBULATORY_CARE_PROVIDER_SITE_OTHER): Payer: Self-pay | Admitting: PHYSICIAN ASSISTANT

## 2024-03-18 ENCOUNTER — Ambulatory Visit: Attending: PHYSICIAN ASSISTANT | Admitting: PHYSICIAN ASSISTANT

## 2024-03-18 VITALS — BP 100/59 | HR 80 | Temp 97.5°F | Resp 18 | Ht 66.5 in | Wt 165.4 lb

## 2024-03-18 DIAGNOSIS — W57XXXA Bitten or stung by nonvenomous insect and other nonvenomous arthropods, initial encounter: Secondary | ICD-10-CM | POA: Insufficient documentation

## 2024-03-18 DIAGNOSIS — T148XXA Other injury of unspecified body region, initial encounter: Secondary | ICD-10-CM | POA: Insufficient documentation

## 2024-03-18 DIAGNOSIS — S20361A Insect bite (nonvenomous) of right front wall of thorax, initial encounter: Secondary | ICD-10-CM

## 2024-03-18 MED ORDER — DOXYCYCLINE HYCLATE 100 MG CAPSULE: 100.0000 mg | ORAL_CAPSULE | Freq: Two times a day (BID) | ORAL | 0 refills | Status: AC

## 2024-03-18 NOTE — Nursing Note (Signed)
 03/18/24 1414   Domestic Violence   Because we are aware of abuse and domestic violence today, we ask all patients: Are you being hurt, hit, or frightened by anyone at your home or in your life?  N   Basic Needs   Do you have any basic needs within your home that are not being met? (such as Food, Shelter, [employer], Tranportation, paying for bills and/or medications) N

## 2024-03-18 NOTE — Nursing Note (Signed)
 The influenza vaccine was not given because the patient/caregiver declined.

## 2024-03-18 NOTE — Nursing Note (Signed)
 03/18/24 1414   PHQ 9 (follow up)   Little interest or pleasure in doing things. 0   Feeling down, depressed, or hopeless 0   Trouble falling or staying asleep, or sleeping too much. 3   Feeling tired or having little energy 3   Poor appetite or overeating 1   Feeling bad about yourself/ that you are a failure in the past 2 weeks? 0   Trouble concentrating on things in the past 2 weeks? 0   Moving/Speaking slowly or being fidgety or restless  in the past 2 weeks? 0   If you checked off any problems, how difficult have these problems made it for you to do your work, take care of things at home, or get along with other people? Somewhat difficult

## 2024-03-18 NOTE — Progress Notes (Signed)
 FAMILY MEDICINE, MEDICAL OFFICE BUILDING  [address]  [place] [address]  Operated by [facility]    Progress Note    Tonya Francis  [birth year]  [id number]    Date of Service: 03/18/2024  2:30 PM EST    Chief complaint:   Chief Complaint   Patient presents with    Tick  Exposure     Patient states that Saturday night she pulled a tick out of her right side and washed the area with soap and water and put rubbing alcohol around the area.        Subjective:     This is a case of a 68 y.o. year old female who comes in for evaluation of a tic bite. Pt.states she was outside 03/16/2024 and went inside and had a tic bite. She removed it but the head was not attached to it. The area is a little tender to touch, no fever, no new body aches, no fever or chills, no nausea, no vomiting. No rashes noted. No itching no SOB    Review of Systems see HPI    Current Outpatient Medications   Medication Sig    atenoloL  (TENORMIN ) 25 mg Oral Tablet Take 1 Tablet (25 mg total) by mouth Daily    cholecalciferol, vitamin D3, 25 mcg (1,000 unit) Oral Tablet Take 2 Tablets (2,000 Units total) by mouth Daily    cyanocobalamin  (VITAMIN B12) 1,000 mcg/mL Injection Solution Inject 1 mL (1,000 mcg total) into the muscle Every 14 days Every 2 wks    doxycycline  hyclate (VIBRAMYCIN ) 100 mg Oral Capsule Take 1 Capsule (100 mg total) by mouth Twice daily for 10 days    EPINEPHrine 0.3 mg/0.3 mL Injection Auto-Injector 0.3 ML ONE TIME ONLY BY INTRAMUSCULAR ROUTE FOR 1 DOSE.    esomeprazole  magnesium  (NEXIUM ) 40 mg Oral Capsule, Delayed Release(E.C.) Take 1 Capsule (40 mg total) by mouth Every morning before breakfast    fluticasone  propionate (FLONASE ) 50 mcg/actuation Nasal Spray, Suspension Administer 1 Spray into each nostril Twice daily    hydroCHLOROthiazide  (HYDRODIURIL ) 12.5 mg Oral Tablet Take 1 Tablet (12.5 mg total) by mouth Daily    hydrocortisone  2.5 % Cream Apply topically Twice daily    hydrOXYzine  HCL  (ATARAX ) 10 mg Oral Tablet Take 1 Tablet (10 mg total) by mouth Three times a day as needed for Itching    Ibuprofen  (MOTRIN ) 800 mg Oral Tablet Take 1 Tablet (800 mg total) by mouth Twice per day as needed    Levocetirizine (XYZAL) 5 mg Oral Tablet Take 1 Tablet (5 mg total) by mouth Every evening    levothyroxine  (SYNTHROID) 75 mcg Oral Tablet Take 1 Tablet (75 mcg total) by mouth Daily    magnesium  Oxide 500 mg magnesium  Oral Tablet Take 1 Tablet (500 mg total) by mouth Daily    prenatal vitamin-iron-folate Tablet Take 1 Tablet by mouth Daily       Objective:     BP (!) 100/59 (Site: Left Arm, Patient Position: Sitting, Cuff Size: Adult)   Pulse 80   Temp 36.4 C (97.5 F) (Tympanic)   Resp 18   Ht 1.689 m (5' 6.5)   Wt 75 kg (165 lb 6.4 oz)   SpO2 97%   BMI 26.30 kg/m     Physical Exam  Vitals and nursing note reviewed.   Constitutional:       Appearance: Normal appearance.   Cardiovascular:      Rate and Rhythm: Normal rate and regular  rhythm.      Heart sounds: Normal heart sounds.   Pulmonary:      Effort: Pulmonary effort is normal.      Breath sounds: Normal breath sounds.   Skin:     General: Skin is warm.      Findings: Erythema and lesion present.      Comments: Small erythematous mild annular shape with dark center less than 5mm no surrounding erythema or inflammation noted. No vesicles   Neurological:      Mental Status: She is alert.           Assessment & Plan  Tick bite  Area/bite cleaned with alcohol and sterile pick ups used to remove dark/hard lesion, Daphney assisted with procedure    Discussed with pt/s/sx to watch with the bite.  Doxycycline  100 mg 1 bid #20/0            Other orders    doxycycline  hyclate (VIBRAMYCIN ) 100 mg Oral Capsule; Take 1 Capsule (100 mg total) by mouth Twice daily for 10 days          The patient was given the opportunity to ask questions and those questions were answered to the patient's satisfaction. The patient was encouraged to call with any additional  questions or concerns.    Follow up: No follow-ups on file.    Gerardine Ammon, PA-C

## 2024-04-08 ENCOUNTER — Encounter (INDEPENDENT_AMBULATORY_CARE_PROVIDER_SITE_OTHER): Payer: Self-pay | Admitting: PHYSICIAN ASSISTANT

## 2024-04-08 ENCOUNTER — Other Ambulatory Visit: Payer: Self-pay

## 2024-04-08 ENCOUNTER — Ambulatory Visit: Payer: Self-pay | Attending: PHYSICIAN ASSISTANT | Admitting: PHYSICIAN ASSISTANT

## 2024-04-08 VITALS — BP 137/73 | HR 56 | Temp 98.2°F | Resp 16 | Ht 66.0 in | Wt 162.8 lb

## 2024-04-08 DIAGNOSIS — J069 Acute upper respiratory infection, unspecified: Secondary | ICD-10-CM

## 2024-04-08 DIAGNOSIS — W57XXXA Bitten or stung by nonvenomous insect and other nonvenomous arthropods, initial encounter: Secondary | ICD-10-CM

## 2024-04-08 MED ORDER — METHYLPREDNISOLONE 4 MG TABLETS IN A DOSE PACK: ORAL_TABLET | ORAL | 1 refills | Status: AC

## 2024-04-08 NOTE — Nursing Note (Signed)
 The influenza vaccine was not given because the patient/caregiver declined.

## 2024-04-08 NOTE — Progress Notes (Signed)
 FAMILY MEDICINE, MEDICAL OFFICE BUILDING  [address]  [place] [address]  Operated by [facility]    Progress Note    Tonya Francis  1956/02/26  [id number]    Date of Service: 04/08/2024 10:30 AM EST    Chief complaint:   Chief Complaint   Patient presents with    Follow Up       Subjective:     This is a case of a 67 y.o. year old female who comes in today for follow up. Pt.states she is doing well. Pt.states she finished Doxycycline .Pt.states no problems from the rash. No fever or chills, no polyarthralgia, no rashes. No other areas.     Review of Systems   All systems reviewed and negative except for what is noted above.     Current Outpatient Medications   Medication Sig    atenoloL  (TENORMIN ) 25 mg Oral Tablet Take 1 Tablet (25 mg total) by mouth Daily    cholecalciferol, vitamin D3, 25 mcg (1,000 unit) Oral Tablet Take 2 Tablets (2,000 Units total) by mouth Daily    cyanocobalamin  (VITAMIN B12) 1,000 mcg/mL Injection Solution Inject 1 mL (1,000 mcg total) into the muscle Every 14 days Every 2 wks    doxycycline  hyclate (VIBRAMYCIN ) 100 mg Oral Capsule Take 1 Capsule (100 mg total) by mouth Twice daily for 10 days    EPINEPHrine 0.3 mg/0.3 mL Injection Auto-Injector 0.3 ML ONE TIME ONLY BY INTRAMUSCULAR ROUTE FOR 1 DOSE.    esomeprazole  magnesium  (NEXIUM ) 40 mg Oral Capsule, Delayed Release(E.C.) Take 1 Capsule (40 mg total) by mouth Every morning before breakfast    fluticasone  propionate (FLONASE ) 50 mcg/actuation Nasal Spray, Suspension Administer 1 Spray into each nostril Twice daily    hydroCHLOROthiazide  (HYDRODIURIL ) 12.5 mg Oral Tablet Take 1 Tablet (12.5 mg total) by mouth Daily    hydrocortisone  2.5 % Cream Apply topically Twice daily (Patient taking differently: Apply 1 Tube topically Once per day as needed for Other)    hydrOXYzine  HCL (ATARAX ) 10 mg Oral Tablet Take 1 Tablet (10 mg total) by mouth Three times a day as needed for Itching    Ibuprofen  (MOTRIN ) 800 mg  Oral Tablet Take 1 Tablet (800 mg total) by mouth Twice per day as needed    Levocetirizine (XYZAL) 5 mg Oral Tablet Take 1 Tablet (5 mg total) by mouth Every evening    levothyroxine  (SYNTHROID) 75 mcg Oral Tablet Take 1 Tablet (75 mcg total) by mouth Daily    magnesium  Oxide 500 mg magnesium  Oral Tablet Take 1 Tablet (500 mg total) by mouth Daily    prenatal vitamin-iron-folate Tablet Take 1 Tablet by mouth Daily       Objective:     BP 137/73 (Site: Right Arm, Patient Position: Sitting, Cuff Size: Adult)   Pulse 56   Temp 36.8 C (98.2 F) (Tympanic)   Resp 16   Ht 1.676 m (5' 6)   Wt 73.8 kg (162 lb 12.8 oz)   SpO2 98%   BMI 26.28 kg/m     Physical Exam  Vitals and nursing note reviewed.   Constitutional:       Appearance: Normal appearance. She is normal weight.   HENT:      Right Ear: Tympanic membrane normal.      Left Ear: Tympanic membrane normal.      Nose: Congestion present.   Cardiovascular:      Rate and Rhythm: Normal rate and regular rhythm.  Heart sounds: Normal heart sounds.   Pulmonary:      Effort: Pulmonary effort is normal.      Breath sounds: Normal breath sounds.   Musculoskeletal:      Cervical back: Normal range of motion and neck supple.   Skin:     General: Skin is warm and dry.   Neurological:      General: No focal deficit present.      Mental Status: She is alert. Mental status is at baseline.   Psychiatric:         Mood and Affect: Mood normal.         Behavior: Behavior normal.         Thought Content: Thought content normal.       Assessment & Plan  URI (upper respiratory infection)  Medrol  dose #1/0       Tick bite  Bite resolved   Discuss precautions etc.          Other orders    Methylprednisolone  (MEDROL , PAK,) 4 mg Oral Tablets, Dose Pack; Take as instructed.          The patient was given the opportunity to ask questions and those questions were answered to the patient's satisfaction. The patient was encouraged to call with any additional questions or  concerns.    Follow up: Return in about 2 months (around 06/09/2024), or 30 minute/.    Simranjit Thayer Nelson-Jones, PA-C

## 2024-04-08 NOTE — Nursing Note (Signed)
 04/08/24 1110   PHQ 9 (follow up)   Little interest or pleasure in doing things. 0   Feeling down, depressed, or hopeless 0   Trouble falling or staying asleep, or sleeping too much. 2   Feeling tired or having little energy 0   Poor appetite or overeating 2   Feeling bad about yourself/ that you are a failure in the past 2 weeks? 0   Trouble concentrating on things in the past 2 weeks? 0   Moving/Speaking slowly or being fidgety or restless  in the past 2 weeks? 0   Thoughts that you would be better off DEAD, or of hurting yourself in some way. 0   If you checked off any problems, how difficult have these problems made it for you to do your work, take care of things at home, or get along with other people? Somewhat difficult   PHQ 9 Total 4

## 2024-04-08 NOTE — Nursing Note (Signed)
 04/08/24 1110   Domestic Violence   Because we are aware of abuse and domestic violence today, we ask all patients: Are you being hurt, hit, or frightened by anyone at your home or in your life?  N   Basic Needs   Do you have any basic needs within your home that are not being met? (such as Food, Shelter, [employer], Tranportation, paying for bills and/or medications) N

## 2024-04-08 NOTE — Nursing Note (Signed)
 04/08/24 1112   Fall Risk Assessment   Do you feel unsteady when standing or walking? No   Do you worry about falling? No   Have you fallen in the past year? No

## 2024-05-01 ENCOUNTER — Other Ambulatory Visit (INDEPENDENT_AMBULATORY_CARE_PROVIDER_SITE_OTHER): Payer: Self-pay | Admitting: PHYSICIAN ASSISTANT

## 2024-05-01 DIAGNOSIS — G44009 Cluster headache syndrome, unspecified, not intractable: Secondary | ICD-10-CM

## 2024-05-10 ENCOUNTER — Other Ambulatory Visit (INDEPENDENT_AMBULATORY_CARE_PROVIDER_SITE_OTHER): Payer: Self-pay | Admitting: PHYSICIAN ASSISTANT

## 2024-05-10 DIAGNOSIS — I10 Essential (primary) hypertension: Secondary | ICD-10-CM

## 2024-05-10 MED ORDER — HYDROCHLOROTHIAZIDE 12.5 MG TABLET: 12.5000 mg | ORAL_TABLET | Freq: Every day | ORAL | 0 refills | Status: AC

## 2024-06-10 ENCOUNTER — Ambulatory Visit (INDEPENDENT_AMBULATORY_CARE_PROVIDER_SITE_OTHER): Payer: Self-pay | Admitting: PHYSICIAN ASSISTANT

## 2024-07-22 ENCOUNTER — Ambulatory Visit (INDEPENDENT_AMBULATORY_CARE_PROVIDER_SITE_OTHER): Payer: Self-pay | Admitting: PHYSICIAN ASSISTANT

## 2024-12-11 ENCOUNTER — Ambulatory Visit (INDEPENDENT_AMBULATORY_CARE_PROVIDER_SITE_OTHER): Payer: Self-pay | Admitting: PHYSICIAN ASSISTANT

## 2024-12-30 ENCOUNTER — Ambulatory Visit (HOSPITAL_COMMUNITY): Payer: Self-pay

## 2025-01-09 ENCOUNTER — Ambulatory Visit (INDEPENDENT_AMBULATORY_CARE_PROVIDER_SITE_OTHER): Payer: Self-pay | Admitting: OTOLARYNGOLOGY
